# Patient Record
Sex: Female | Born: 1935 | Race: White | Hispanic: No | Smoking: Never smoker
Health system: Southern US, Community
[De-identification: ages and names within clinical notes are randomized; demographics above are authoritative.]

## PROBLEM LIST (undated history)

## (undated) DIAGNOSIS — E785 Hyperlipidemia, unspecified: Secondary | ICD-10-CM

## (undated) DIAGNOSIS — I10 Essential (primary) hypertension: Secondary | ICD-10-CM

## (undated) DIAGNOSIS — E079 Disorder of thyroid, unspecified: Secondary | ICD-10-CM

## (undated) HISTORY — DX: Essential (primary) hypertension: I10

## (undated) HISTORY — PX: DILATION AND CURETTAGE OF UTERUS: SHX78

## (undated) HISTORY — PX: CHOLECYSTECTOMY: SHX55

## (undated) HISTORY — DX: Hyperlipidemia, unspecified: E78.5

## (undated) HISTORY — PX: BREAST SURGERY: SHX581

---

## 2001-04-03 ENCOUNTER — Encounter: Admission: RE | Admit: 2001-04-03 | Discharge: 2001-04-03 | Payer: Self-pay | Admitting: Orthopedic Surgery

## 2001-04-03 ENCOUNTER — Ambulatory Visit (HOSPITAL_BASED_OUTPATIENT_CLINIC_OR_DEPARTMENT_OTHER): Admission: RE | Admit: 2001-04-03 | Discharge: 2001-04-04 | Payer: Self-pay | Admitting: Orthopedic Surgery

## 2001-04-03 ENCOUNTER — Encounter: Payer: Self-pay | Admitting: Orthopedic Surgery

## 2004-05-08 ENCOUNTER — Inpatient Hospital Stay (HOSPITAL_COMMUNITY): Admission: RE | Admit: 2004-05-08 | Discharge: 2004-05-11 | Payer: Self-pay | Admitting: Orthopedic Surgery

## 2004-05-08 ENCOUNTER — Ambulatory Visit: Payer: Self-pay | Admitting: Physical Medicine & Rehabilitation

## 2004-05-11 ENCOUNTER — Inpatient Hospital Stay
Admission: RE | Admit: 2004-05-11 | Discharge: 2004-05-16 | Payer: Self-pay | Admitting: Physical Medicine & Rehabilitation

## 2005-11-07 ENCOUNTER — Encounter: Admission: RE | Admit: 2005-11-07 | Discharge: 2005-11-07 | Payer: Self-pay | Admitting: Orthopaedic Surgery

## 2007-02-12 ENCOUNTER — Encounter: Admission: RE | Admit: 2007-02-12 | Discharge: 2007-02-12 | Payer: Self-pay | Admitting: Orthopedic Surgery

## 2007-02-13 ENCOUNTER — Ambulatory Visit (HOSPITAL_BASED_OUTPATIENT_CLINIC_OR_DEPARTMENT_OTHER): Admission: RE | Admit: 2007-02-13 | Discharge: 2007-02-13 | Payer: Self-pay | Admitting: Orthopedic Surgery

## 2009-07-14 ENCOUNTER — Inpatient Hospital Stay: Payer: Self-pay | Admitting: Internal Medicine

## 2010-06-25 ENCOUNTER — Encounter: Payer: Self-pay | Admitting: Orthopaedic Surgery

## 2010-10-17 NOTE — Op Note (Signed)
[name], [name]                  ACCOUNT NO.:  [id number]   MEDICAL RECORD NO.:  [id number]          PATIENT TYPE:  AMB   LOCATION:  DSC                          FACILITY:  [facility]   PHYSICIAN:  Loreta Ave, M.D. DATE OF BIRTH:  [birth year]   DATE OF PROCEDURE:  02/13/2007  DATE OF DISCHARGE:                               OPERATIVE REPORT   PREOPERATIVE DIAGNOSIS:  Right shoulder recurrent impingement syndrome  with question recurrent rotator cuff tear.  Rupture long head biceps  tendon.   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  Complete tear long head biceps tendon with a large  retained segment of this intra-articular.  No evidence of recurrent cuff  tear or significant impingement.   PROCEDURE:  Right shoulder exam under anesthesia, arthroscopy with  debridement of remaining stump of biceps tendon.  Glenohumeral  chondroplasty with removal of synovitis.  Assessment of subacromial  space.   SURGEON:  Loreta Ave, M.D.   ASSISTANT:  Genene Churn. Barry Dienes, P.A.-C.   ANESTHESIA:  General   BLOOD LOSS:  Minimal.   SPECIMENS:  None.   COMPLICATIONS:  None.   DRESSING:  Soft compressive with a sling.   DESCRIPTION OF PROCEDURE:  The patient was brought to the operating room  and placed on the operating table in a supine position.  After adequate  anesthesia had been obtained, the right shoulder was examined.  Full  motion stable shoulder.  Obvious rupture of long head of biceps tendon.  Placed in the beach chair position on a shoulder positioner, prepped and  draped in the usual sterile fashion.  Three portals, anterior,  posterior, and lateral.  Shoulder entered with a blunt obturator and  distended.  Arthroscope was introduced, inspected.  Obvious complete  tear long head biceps tendon.  A relatively long stump still mobile  within the shoulder.  That was complete debrided.  Some grade 2 and 3  changes throughout the  glenohumeral joint with chondral loose bodies,  all debrided and chondroplasty to a stable surface.  No areas of grade  4.  The under surface of the cuff was intact.  Cannula redirected  subacromially.  No evidence of recurrent bony impingement, either from  the acromion or the [facility] interval.  I thoroughly inspected the top of the  cuff from all angles.  There were no recurrent tears.  Some bursitis and  adhesions  debrided.  After confirmation of no recurrent tear, the instruments and  fluid were removed.  The portals were all closed with nylon.  A sterile  compressive dressing applied.  Sling applied.  Anesthesia reversed.  Brought to the recovery room. Tolerated surgery well.  No complications.      Loreta Ave, M.D.  Electronically Signed     DFM/MEDQ  D:  02/13/2007  T:  02/13/2007  Job:  [id number]

## 2010-10-20 NOTE — Discharge Summary (Signed)
[name], [name]                  ACCOUNT NO.:  [id number]   MEDICAL RECORD NO.:  [id number]          PATIENT TYPE:  INP   LOCATION:  5018                         FACILITY:  [facility]   PHYSICIAN:  Loreta Ave, M.D. DATE OF BIRTH:  10-25-1927   DATE OF ADMISSION:  05/08/2004  DATE OF DISCHARGE:  05/11/2004                           DISCHARGE SUMMARY - REFERRING   DISCHARGE DIAGNOSES:  1.  Status post right total hip replacement for end-stage degenerative joint      disease.  2.  Hypothyroidism, not otherwise specified.  3.  Long-term use of anticoagulants.   HISTORY OF PRESENT ILLNESS:  This 77 year old white female with end-stage  right hip degenerative joint disease presented to our office with a history  of chronic pain.  She had progressive worsening of symptoms with a failed  response to conservative treatment, a significant decrease in her daily  activities due to the ongoing complaints.   PRE-ADMISSION LABORATORY DATA:  WBC 8.9, hemoglobin 14.3, hematocrit 41.0,  platelets 292.  PT 12.7, INR 0.9, PTT 32.  Sodium 139, potassium 3.8,  chloride 105, CO2 of 29, glucose 81, BUN 14, creatinine 1.1, calcium 9.2.  TB 6.0, albumin 3.7.  AST 26, ALT 17, alkaline phosphatase 60, total  bilirubin 0.8.  Urinalysis negative.   HOSPITAL COURSE:  On May 08, 2004, the patient was taken to the [facility] Operating Room, and a right total hip replacement  procedure was performed.  The surgeon was Dr. Loreta Ave, assisted by  Genene Churn. Barry Dienes, P.A.-C.  Anesthesia general.  EBL 500 mL.  There were no  surgical or anesthesia complications.  The patient was transferred to the  recovery room in stable condition.   On May 09, 2004, the patient was doing well.  No specific complaints.  Pharmacy protocol Coumadin started.  Sodium was 125, potassium 3.7, chloride  96, CO2 of 25, BUN 8, creatinine 0.8, glucose 120.  Hemoglobin 11.8.  INR  was 1.0.  His temperature  was 99.4 degrees.  The wound looked good.  The  staples were intact.  No signs of infection.  She was neurovascularly  intact.  The calf was nontender.  Change IV to normal saline plus 20 of KCl.  Dressing changes.  Bed to chair b.i.d.   On May 10, 2004, the patient had good pain control.  Was somewhat  lethargic earlier, but this has resolved.  Temperature was 97.8 degrees,  pulse 82, respirations 24, blood pressure 119/54.  Sodium 137, potassium  4.0.  INR 1.1.  Hemoglobin 12.0.  The wound looked good.  No signs of  infection.  Mild right calf tenderness.  No swelling.  Neurovascularly  intact.  Negative Homans.  Discontinued Foley.  IV heparin locked.  There is  a question of some rash and nausea with Percocet, and the pain medication  was changed to [employer] one tab p.o. q.4-6h. p.r.n.   On May 11, 2004, the patient had good pain control.  Some increased  sedation with Mepergan Forte.  Temperature 99.8 degrees, pulse 79,  respirations 24,  blood pressure 108/78.  Electrolytes stable.  INR 1.3.  Hemoglobin 11.2.  The wound looked good with no signs of infection.  Neurovascularly intact.  Discontinue the Mepergan Forte and start Demerol 50  mg, one tab p.o. q.4-6h. p.r.n. pain.  Discontinued IV.   DISPOSITION:  The patient was ready for a transfer to [facility].   MEDICATIONS:  1.  Pharmacy protocol Coumadin.  2.  Demerol 50 mg, one tab p.o. q.4-6h. p.r.n. pain.  3.  Resume all previously-ordered medications.   CONDITION ON DISCHARGE:  Good and stable.   DISCHARGE INSTRUCTIONS:  The patient will work with physical therapy to  improve ambulation and strengthening.  Hip staples to be removed two weeks  postoperatively.  Will remain on Coumadin for deep venous thrombosis  prophylaxis x3-4 weeks postoperatively.   FOLLOWUP:  She will follow up in our office in two weeks for a recheck.  To  return sooner p.r.n.      JMO/MEDQ  D:  07/10/2004  T:  07/10/2004  Job:  [id number]

## 2010-10-20 NOTE — Op Note (Signed)
[place]  Patient:    [name], [name] [facility] Visit Number: [id number] MRN: [id number]          Service Type: DSU Location: [facility] Attending Physician:  Colbert Ewing Dictated by:   Loreta Ave, M.D. Proc. Date: 04/03/01 Admit Date:  04/03/2001                             Operative Report  PREOPERATIVE DIAGNOSES: 1. Rotator cuff tear, right shoulder, impingement. 2. Post-traumatic degenerative joint disease, acromioclavicular joint.  POSTOPERATIVE DIAGNOSES: 1. Rotator cuff tear, right shoulder, impingement. 2. Post-traumatic degenerative joint disease, acromioclavicular joint.  PROCEDURES: 1. Right shoulder examination under anesthesia, arthroscopy, assessment of    rotator cuff. 2. Arthroscopic acromioplasty with coracoacromial ligament release. 3. Excision distal clavicle. 4. Mini-open repair rotator cuff tear with Concept repair system, utilizing    #2 Fibrewire.  SURGEON:  Loreta Ave, M.D.  ASSISTANT:  Arlys John D. Petrarca, P.A.-C.  ANESTHESIA:  General.  ESTIMATED BLOOD LOSS:  Minimal.  SPECIMENS:  None.  CULTURES:  None.  DRESSING:  Soft compressive with shoulder immobilizer.  DESCRIPTION OF PROCEDURE:  Patient brought to the operating room and placed on the operating table in supine position.   After adequate anesthesia had been obtained, she was placed in a beach chair position.  With this she had an episode of transient hypotension that improved immediately as soon as she was brought back down flat.  Once there was adequate stabilization with fluid replacement, we were able to sit her up comfortably without drop in blood pressure.  Shoulder examined.  Full motion, stable shoulder.  Prepped and draped in the usual sterile fashion.  Three standard arthroscopic portals, anterior, posterior, lateral.  Shoulder entered with a blunt obturator and distended and inspected.  Glenohumeral joint had minimal degenerative  changes. Labrum intact.  Biceps tendon and biceps anchor intact.  Capsular and ligamentous structures intact.  Avulsion of the entire supraspinatus tendon from the biceps tendon all the way back to the infraspinatus.  A thin slip of capsule partially below but essentially a functional complete tear with about a centimeter of retraction.  Reasonable tissue quality and mobile.  The cannula was redirected subacromially.  Cuff tear assessed, and it was found to be very repairable.  Type 2 acromion with evidence of impingement and abrasive tearing on the remaining cuff.  Bursa resected, cuff debrided ,and acromioplasty to a type 1 acromion utilizing all three portals and the shaver and high-speed bur.  CA ligament incised, hemostasis with cautery.  Distal clavicle with grade 4 changes with periarticular spurs.  Lateral centimeter resected and periarticular spurs removed.  Adequacy of decompression and acromioplasty, distal clavicle excision confirmed viewing from all portals. Instruments and fluid removed.  Lateral portal opened into a deltoid-splitting incision exposing the subacromial space.  The cuff was mobilized and well-captured with two #2 Fibrewire sutures that were woven in a [place] fashion well into the cuff to well capture the cuff.  Bony trough created in the humerus at the original attachment site.  The sutures were woven through bony tunnels utilizing Concept repair system.  Once complete, these were pulled out firmly and tied over a bony bridge, yielding a nice, firm, watertight closure of the cuff without undue tension even through full passive motion.  Adequacy of decompression confirmed digitally at the time of cuff repair.  Wound copiously irrigated.  Deltoid closed with Vicryl.  Skin and  subcutaneous tissue with Vicryl.  Portals closed with nylon.  Margins of the wound injected with Marcaine, as was the subacromial space.  Sterile compressive dressing applied.  Shoulder  immobilizer applied.  Anesthesia reversed.  Brought to the recovery room.  She tolerated the surgery well with no complications. Dictated by:   Loreta Ave, M.D. Attending Physician:  Colbert Ewing DD:  04/04/01 TD:  04/05/01 Job: [id number]

## 2010-10-20 NOTE — Op Note (Signed)
[name], [name]                  ACCOUNT NO.:  [id number]   MEDICAL RECORD NO.:  [id number]          PATIENT TYPE:  INP   LOCATION:  2856                         FACILITY:  [facility]   PHYSICIAN:  Loreta Ave, M.D. DATE OF BIRTH:  09/19/1927   DATE OF PROCEDURE:  05/08/2004  DATE OF DISCHARGE:                                 OPERATIVE REPORT   PREOPERATIVE DIAGNOSES:  End stage degenerative arthritis, right hip.   POSTOPERATIVE DIAGNOSES:  End stage degenerative arthritis, right hip.   OPERATION PERFORMED:  Right total hip replacement.  Osteonics prosthesis.  Press-fit 52 mm Trident acetabular component, screw fixation times two.  10  degree polyethylene insert, size E cup 32 mm internal diameter.  Cemented  femoral component 36 Eon Plus stem.  A #2 distal cement restricter.  A 32 mm  metallic head, -5.   SURGEON:  Loreta Ave, M.D.   ASSISTANT:  Genene Churn. Denton Meek.   ANESTHESIA:  General.   ESTIMATED BLOOD LOSS:  500 mL.   BLOOD REPLACED:  None.   SPECIMENS:  Excised bone and soft tissue.   CULTURES:  None.   COMPLICATIONS:  None.   DRESSING:  Soft compressive with abduction pillow.   DESCRIPTION OF PROCEDURE:  The patient was brought to the operating room and  after adequate anesthesia had been obtained, turned to a lateral position  with appropriate padding and support.  Prepped and draped in the usual  sterile fashion.  Incision along the femur extending posterior superior.  Skin and subcutaneous tissue were divided.  Iliotibial band incised and  Charnley retractor put in place.  Neurovascular structures identified and  protected.  External rotator and cuff was taken down off the back of the  intertrochanteric groove on the femur and tagged with FiberWire.  Hip  dislocated posteriorly.  Grade 4 changes.  Femoral head removed one  fingerbreadth above the lesser trochanter in line with the definitive  prosthesis.  Acetabulum exposed.  Grade 4 changes.  Soft  tissue removed.  Sequential reaming up to nice spherical bleeding bone appropriate medial and  inferior placement.  Sized for a 52 mm cuff which was hammered in place with  45 degrees of abduction and 20 degrees of anteversion.  Excellent capturing  and fixation.  Augmented with two screws through the cuff, a 60 mm and a 20  mm.  10 degree polyethylene insert with the overhang placed superior was  then hammered in place in the metallic cup.  32 mm internal diameter.  Attention was turned to the femur.  Reaming with a power and hand held  reamers up to good sizing proximal and distal for a #6 cemented component.  Appropriate trialing and choosing a #6 component with a -5 mm 32 mm head.  With that head and restoring normal anteversion of the femur with a  component, I had excellent leg length restoration and good stability in  flexion and extension.  All trials were removed.  Distal cement restricter  placed 2 cm distal to the femoral component after appropriate sizing.  Copious irrigation of  the femur with the pulse irrigating device.  Cement  prepared, placed down the canal and the femoral component firmly cemented  into place restoring normal anteversion.  Once the cement had hardened, the  head was attached and the hip reduced.  Good restoration of leg lengths,  excellent stability of flexion and extension.  Wound irrigated.  External  rotator and capsule repaired to the back of the intertrochanteric groove on  the femur with the FiberWire and tied over a bony bridge.  Charnley  retractor removed.  Iliotibial band closed with #1 Vicryl.  Skin and  subcutaneous tissue with Vicryl and then staples.  Margins of the wound  injected with Marcaine.  Sterile compressive dressing applied.  Returned to  supine position.  Abduction pillow placed.  Anesthesia reversed.  Brought to  recovery room.  Tolerated surgery well without complication.      Valentino Saxon   DFM/MEDQ  D:  05/08/2004  T:  05/08/2004   Job:  [id number]
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
## 2011-03-16 LAB — POCT HEMOGLOBIN-HEMACUE
Hemoglobin: 17.5 — ABNORMAL HIGH
Operator id: 208731

## 2011-04-30 ENCOUNTER — Other Ambulatory Visit: Payer: Self-pay | Admitting: Orthopedic Surgery

## 2011-04-30 DIAGNOSIS — M545 Low back pain, unspecified: Secondary | ICD-10-CM

## 2011-05-08 ENCOUNTER — Ambulatory Visit
Admission: RE | Admit: 2011-05-08 | Discharge: 2011-05-08 | Disposition: A | Discharge status: Home / Self Care | Payer: Medicare Other | Source: Ambulatory Visit | Attending: Orthopedic Surgery | Admitting: Orthopedic Surgery

## 2011-05-08 DIAGNOSIS — M545 Low back pain, unspecified: Secondary | ICD-10-CM

## 2011-05-08 MED ORDER — METHYLPREDNISOLONE ACETATE 40 MG/ML INJ SUSP (RADIOLOG
120.0000 mg | Freq: Once | INTRAMUSCULAR | Status: AC
  Administered 2011-05-08: 120 mg via EPIDURAL

## 2011-05-08 MED ORDER — IOHEXOL 180 MG/ML  SOLN
1.0000 mL | Freq: Once | INTRAMUSCULAR | Status: AC | PRN
  Administered 2011-05-08: 1 mL via EPIDURAL

## 2011-05-08 NOTE — Patient Instructions (Signed)

## 2012-04-18 DIAGNOSIS — I10 Essential (primary) hypertension: Secondary | ICD-10-CM | POA: Insufficient documentation

## 2012-10-07 ENCOUNTER — Other Ambulatory Visit: Payer: Self-pay | Admitting: Orthopedic Surgery

## 2012-10-07 DIAGNOSIS — M4800 Spinal stenosis, site unspecified: Secondary | ICD-10-CM

## 2012-10-09 ENCOUNTER — Ambulatory Visit
Admission: RE | Admit: 2012-10-09 | Discharge: 2012-10-09 | Disposition: A | Discharge status: Home / Self Care | Payer: Medicare Other | Source: Ambulatory Visit | Attending: Orthopedic Surgery | Admitting: Orthopedic Surgery

## 2012-10-09 VITALS — BP 173/88 | HR 54

## 2012-10-09 DIAGNOSIS — M5126 Other intervertebral disc displacement, lumbar region: Secondary | ICD-10-CM

## 2012-10-09 DIAGNOSIS — M4800 Spinal stenosis, site unspecified: Secondary | ICD-10-CM

## 2012-10-09 MED ORDER — METHYLPREDNISOLONE ACETATE 40 MG/ML INJ SUSP (RADIOLOG
120.0000 mg | Freq: Once | INTRAMUSCULAR | Status: AC
  Administered 2012-10-09: 120 mg via EPIDURAL

## 2012-10-09 MED ORDER — IOHEXOL 180 MG/ML  SOLN
1.0000 mL | Freq: Once | INTRAMUSCULAR | Status: AC | PRN
  Administered 2012-10-09: 1 mL via EPIDURAL

## 2012-10-09 NOTE — Discharge Instructions (Signed)
 Post Procedure Spinal Discharge Instruction Sheet  1. You may resume a regular diet and any medications that you routinely take (including pain medications).  2. No driving day of procedure.  3. Light activity throughout the rest of the day.  Do not do any strenuous work, exercise, bending or lifting.  The day following the procedure, you can resume normal physical activity but you should refrain from exercising or physical therapy for at least three days thereafter.   Common Side Effects:   Headaches- take your usual medications as directed by your physician.  Increase your fluid intake.  Caffeinated beverages may be helpful.  Lie flat in bed until your headache resolves.   Restlessness or inability to sleep- you may have trouble sleeping for the next few days.  Ask your referring physician if you need any medication for sleep.   Facial flushing or redness- should subside within a few days.   Increased pain- a temporary increase in pain a day or two following your procedure is not unusual.  Take your pain medication as prescribed by your referring physician.   Leg cramps  Please contact our office at [phone] for the following symptoms:  Fever greater than 100 degrees.  Headaches unresolved with medication after 2-3 days.  Increased swelling, pain, or redness at injection site.  Thank you for visiting our office.

## 2012-10-13 ENCOUNTER — Telehealth: Payer: Self-pay | Admitting: Radiology

## 2012-10-13 NOTE — Telephone Encounter (Signed)
Pt having some numbness and tingling and is not feeling better yet from epidural injections. Explained that it may take a full 7 days for her to feel better and if she is not better to f/u with Dr. Eulah Pont.

## 2012-10-20 ENCOUNTER — Emergency Department: Payer: Self-pay | Admitting: Unknown Physician Specialty

## 2012-10-20 LAB — COMPREHENSIVE METABOLIC PANEL
Alkaline Phosphatase: 52 U/L (ref 50–136)
Co2: 29 mmol/L (ref 21–32)
Creatinine: 0.73 mg/dL (ref 0.60–1.30)
EGFR (Non-African Amer.): >60
Osmolality: 262 (ref 275–301)
Potassium: 4.1 mmol/L (ref 3.5–5.1)
SGOT(AST): 65 U/L — ABNORMAL HIGH (ref 15–37)
Sodium: 130 mmol/L — ABNORMAL LOW (ref 136–145)
Total Protein: 7.5 g/dL (ref 6.4–8.2)

## 2012-10-20 LAB — CBC
HCT: 43.9 % (ref 35.0–47.0)
HGB: 14.9 g/dL (ref 12.0–16.0)
MCH: 33.8 pg (ref 26.0–34.0)
MCHC: 34 g/dL (ref 32.0–36.0)
MCV: 100 fL (ref 80–100)
Platelet: 267 x10 3/mm 3 (ref 150–440)
RBC: 4.4 X10 6/mm 3 (ref 3.80–5.20)
RDW: 13.1 % (ref 11.5–14.5)
WBC: 12 10*3/uL — ABNORMAL HIGH (ref 3.6–11.0)

## 2012-10-20 LAB — COMPREHENSIVE METABOLIC PANEL WITH GFR
Albumin: 4.4 g/dL (ref 3.4–5.0)
Anion Gap: 7 (ref 7–16)
BUN: 17 mg/dL (ref 7–18)
Bilirubin,Total: 0.9 mg/dL (ref 0.2–1.0)
Calcium, Total: 9.5 mg/dL (ref 8.5–10.1)
Chloride: 94 mmol/L — ABNORMAL LOW (ref 98–107)
EGFR (African American): >60
Glucose: 94 mg/dL (ref 65–99)
SGPT (ALT): 55 U/L (ref 12–78)

## 2012-10-20 LAB — TROPONIN I: Troponin-I: <0.02 ng/mL

## 2012-11-21 DIAGNOSIS — M159 Polyosteoarthritis, unspecified: Secondary | ICD-10-CM | POA: Insufficient documentation

## 2012-11-21 DIAGNOSIS — H6120 Impacted cerumen, unspecified ear: Secondary | ICD-10-CM | POA: Insufficient documentation

## 2012-11-21 DIAGNOSIS — K5790 Diverticulosis of intestine, part unspecified, without perforation or abscess without bleeding: Secondary | ICD-10-CM | POA: Insufficient documentation

## 2012-11-21 DIAGNOSIS — N959 Unspecified menopausal and perimenopausal disorder: Secondary | ICD-10-CM | POA: Insufficient documentation

## 2012-11-21 DIAGNOSIS — E039 Hypothyroidism, unspecified: Secondary | ICD-10-CM | POA: Insufficient documentation

## 2013-04-01 DIAGNOSIS — J302 Other seasonal allergic rhinitis: Secondary | ICD-10-CM | POA: Insufficient documentation

## 2013-04-01 DIAGNOSIS — H9190 Unspecified hearing loss, unspecified ear: Secondary | ICD-10-CM | POA: Insufficient documentation

## 2013-09-20 ENCOUNTER — Observation Stay: Payer: Self-pay | Admitting: Internal Medicine

## 2013-09-20 LAB — URINALYSIS, COMPLETE
Bacteria: NONE SEEN
Bilirubin,UR: NEGATIVE
Blood: NEGATIVE
Glucose,UR: NEGATIVE mg/dL (ref 0–75)
Ketone: NEGATIVE
Nitrite: NEGATIVE
Ph: 7 (ref 4.5–8.0)
Protein: NEGATIVE
RBC,UR: 1 /HPF (ref 0–5)
Specific Gravity: 1.005 (ref 1.003–1.030)
Squamous Epithelial: 1
WBC UR: 1 /HPF (ref 0–5)

## 2013-09-20 LAB — COMPREHENSIVE METABOLIC PANEL
Albumin: 3.5 g/dL (ref 3.4–5.0)
Alkaline Phosphatase: 49 U/L
Anion Gap: 6 — ABNORMAL LOW (ref 7–16)
Bilirubin,Total: 0.4 mg/dL (ref 0.2–1.0)
Co2: 31 mmol/L (ref 21–32)
Creatinine: 1.03 mg/dL (ref 0.60–1.30)
EGFR (African American): 58 — ABNORMAL LOW
EGFR (Non-African Amer.): 50 — ABNORMAL LOW
Glucose: 94 mg/dL (ref 65–99)
Potassium: 4 mmol/L (ref 3.5–5.1)
SGOT(AST): 49 U/L — ABNORMAL HIGH (ref 15–37)
SGPT (ALT): 34 U/L (ref 12–78)
Sodium: 139 mmol/L (ref 136–145)
Total Protein: 6.8 g/dL (ref 6.4–8.2)

## 2013-09-20 LAB — CBC
HCT: 45.6 % (ref 35.0–47.0)
HGB: 14.9 g/dL (ref 12.0–16.0)
MCH: 32.3 pg (ref 26.0–34.0)
MCHC: 32.6 g/dL (ref 32.0–36.0)
MCV: 99 fL (ref 80–100)
Platelet: 200 10*3/uL (ref 150–440)
RBC: 4.6 X10 6/mm 3 (ref 3.80–5.20)
RDW: 12.8 % (ref 11.5–14.5)
WBC: 8.3 10*3/uL (ref 3.6–11.0)

## 2013-09-20 LAB — COMPREHENSIVE METABOLIC PANEL WITH GFR
BUN: 14 mg/dL (ref 7–18)
Calcium, Total: 9.2 mg/dL (ref 8.5–10.1)
Chloride: 102 mmol/L (ref 98–107)
Osmolality: 278 (ref 275–301)

## 2013-09-20 LAB — CK TOTAL AND CKMB (NOT AT ARMC)
CK, Total: 153 U/L
CK, Total: 228 U/L — ABNORMAL HIGH
CK-MB: 3.6 ng/mL (ref 0.5–3.6)
CK-MB: 4.7 ng/mL — ABNORMAL HIGH (ref 0.5–3.6)

## 2013-09-20 LAB — TROPONIN I
Troponin-I: <0.02 ng/mL
Troponin-I: <0.02 ng/mL
Troponin-I: <0.02 ng/mL

## 2013-09-20 LAB — D-DIMER(ARMC): D-Dimer: 1181 ng/ml

## 2013-09-21 LAB — CBC WITH DIFFERENTIAL/PLATELET
Eosinophil: 3 %
HCT: 41.5 % (ref 35.0–47.0)
HGB: 13.7 g/dL (ref 12.0–16.0)
Lymphocytes: 67 %
MCH: 32.7 pg (ref 26.0–34.0)
MCHC: 33 g/dL (ref 32.0–36.0)
MCV: 99 fL (ref 80–100)
Monocytes: 12 %
Platelet: 194 x10 3/mm 3 (ref 150–440)
RBC: 4.19 10*6/uL (ref 3.80–5.20)
RDW: 12.7 % (ref 11.5–14.5)
Segmented Neutrophils: 11 %
Variant Lymphocyte - H1-Rlymph: 7 %
WBC: 5.9 x10 3/mm 3 (ref 3.6–11.0)

## 2013-09-21 LAB — BASIC METABOLIC PANEL WITH GFR
Anion Gap: 5 — ABNORMAL LOW (ref 7–16)
BUN: 13 mg/dL (ref 7–18)
Chloride: 109 mmol/L — ABNORMAL HIGH (ref 98–107)
Co2: 28 mmol/L (ref 21–32)
Creatinine: 0.82 mg/dL (ref 0.60–1.30)
Glucose: 92 mg/dL (ref 65–99)
Osmolality: 283 (ref 275–301)

## 2013-09-21 LAB — BASIC METABOLIC PANEL
Calcium, Total: 8.3 mg/dL — ABNORMAL LOW (ref 8.5–10.1)
EGFR (African American): >60
EGFR (Non-African Amer.): >60
Potassium: 4 mmol/L (ref 3.5–5.1)
Sodium: 142 mmol/L (ref 136–145)

## 2013-09-21 LAB — TSH: Thyroid Stimulating Horm: 4.19 u[IU]/mL

## 2014-09-25 NOTE — H&P (Signed)
PATIENT NAME:  [name], [name] MR#:  [id number] DATE OF BIRTH:  04-19-1928  DATE OF ADMISSION:  09/20/2013  REFERRING PHYSICIAN: Sheryl L. Mindi Junker, MD  FAMILY PHYSICIAN: Dr. Carleene Cooper in [place].   REASON FOR ADMISSION: Syncope.   HISTORY OF PRESENT ILLNESS: The patient is an 81 year old female with a history of benign hypertension, hypothyroidism, osteoarthritis, chronic back pain. Presents to the Emergency Room after passing out in church. Felt lightheaded prior to her syncopal episode. Denies any previous history of same. Denies any cardiac history or history of stroke. In the Emergency Room, the patient was noted to have an elevated d-dimer. She was also mildly bradycardic. She is now admitted for further evaluation.   PAST MEDICAL HISTORY: 1.  Benign hypertension.  2.  Hypothyroidism.  3.  Osteoarthritis.  4.  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  5.  Previous splenectomy.  6.  Status post rotator cuff surgery.  7.  Status post right hip surgery.  8.  Environmental allergies.   MEDICATIONS:  1.  B complex 1 p.o. daily.  2.  Tramadol 50 mg p.o. b.i.d.  3.  Synthroid 50 mcg p.o. daily.  4.  Provera 2.5 mg p.o. 2 times per week.  5.  Estrace 0.5 mg p.o. 2 times per week.  6.  Claritin 10 mg p.o. daily.  7.  Fosamax 70 mg p.o. q. week.  8.  Clonidine 0.1 mg p.o. daily.  9.  Aspirin 81 mg p.o. daily.   ALLERGIES: No known drug allergies.   SOCIAL HISTORY: The patient is widowed. No history of alcohol or tobacco abuse.   FAMILY HISTORY: Positive for stroke and hypertension but otherwise unremarkable.   REVIEW OF SYSTEMS:    CONSTITUTIONAL: No fever or change in weight.  EYES: No blurred or double vision. No glaucoma.  ENT: No tinnitus or hearing loss. No nasal discharge or bleeding. No difficulty swallowing.  RESPIRATORY: No cough or wheezing. Denies hemoptysis.  CARDIOVASCULAR: No chest pain or orthopnea. No palpitations.  GASTROINTESTINAL: No nausea, vomiting or  diarrhea. No abdominal pain. No change in bowel habits.  GENITOURINARY: No dysuria or hematuria. No incontinence.  ENDOCRINE: No polyuria or polydipsia. No heat or cold intolerance.  HEMATOLOGIC: The patient denies anemia, easy bruising or bleeding.  LYMPHATIC: No swollen glands.  MUSCULOSKELETAL: The patient denies pain in her neck, back, shoulders, knees or hips. No gout.  NEUROLOGIC: No numbness or migraines. Denies stroke or seizures.  PSYCHOLOGICAL: The patient denies anxiety, insomnia or depression.   PHYSICAL EXAMINATION: GENERAL: The patient is in no acute distress.  VITAL SIGNS: Currently remarkable for a blood pressure of 168/88, heart rate 64, respiratory rate of 18, temperature of 98, sat 96% on room air.  HEENT: Normocephalic, atraumatic. Pupils are equal, round and reactive to light and accommodation. Extraocular movements are intact. Sclerae are anicteric. Conjunctivae are clear. Oropharynx clear. NECK: Supple without JVD. No adenopathy or thyromegaly is seen.  LUNGS: Clear to auscultation and percussion without wheezes, rales or rhonchi. No dullness. Respiratory effort is normal.  CARDIAC: Regular rate and rhythm. Normal S1, S2. No significant rubs, murmurs or gallops. PMI is nondisplaced. Chest wall is nontender.  ABDOMEN: Soft, nontender with normoactive bowel sounds. No organomegaly or masses were appreciated. No hernias or bruits were noted.  EXTREMITIES: Without clubbing, cyanosis or edema. Pulses were 2+ bilaterally.  SKIN: Warm and dry without rash or lesions.  NEUROLOGIC: Cranial nerves II through XII grossly intact. Deep tendon reflexes were symmetric. Motor and sensory  exams nonfocal.  PSYCHIATRIC: Revealed a patient who is alert and oriented to person, place and time. She was cooperative and used good judgment.    LABORATORY, DIAGNOSTIC AND RADIOLOGICAL DATA:  Glucose was 94 with a BUN of 14, creatinine 1.03 and a GFR of 50; sodium 139, with potassium 4.0. Troponin  was less than 0.02. White count 8.3 with a hemoglobin of 14.9. Urinalysis negative. D-dimer was elevated at 1181. CT of the head revealed no acute intracranial abnormalities. Chest x-ray revealed an elevated right hemidiaphragm but was otherwise unremarkable.   ASSESSMENT: 1.  Syncope.  2.  Benign hypertension.  3.  Elevated d-dimer.  4.  Bradycardia.  5.  Hypothyroidism.  6.  Osteoarthritis.   PLAN: The patient will be observed on telemetry. We will follow serial cardiac enzymes. Will follow her bradycardia as well. We will obtain carotid Dopplers and an echocardiogram because of her syncopal episode. We will obtain a V/Q scan to rule out PE given her syncope and elevated d-dimer. Will proceed with MRI of the brain without contrast to rule out stroke. We will check her TSH. Follow up routine labs in the morning. Further treatment and evaluation will depend upon the patient's progress.   TOTAL TIME SPENT ON THIS PATIENT: 50 minutes.    ____________________________ Duane LopeJeffrey D. Judithann SheenSparks, MD jds:cs D: 09/20/2013 14:35:27 ET T: 09/20/2013 15:43:45 ET JOB#: [id number]  cc: Duane LopeJeffrey D. Judithann SheenSparks, MD, <Dictator> Carleene CooperJonathan Klein Jayvyn Haselton D Hutch Rhett MD ELECTRONICALLY SIGNED 09/20/2013 17:13

## 2014-09-25 NOTE — Discharge Summary (Signed)
PATIENT NAME:  [name], [name] MR#:  [id number] DATE OF BIRTH:  [birth year]  DATE OF ADMISSION:  09/20/2013 DATE OF DISCHARGE:  09/21/2013  PRESENTING COMPLAINT: Syncopal episode.   DISCHARGE DIAGNOSES:  1.  Syncope suspected due to dehydration from diarrhea, resolved.  2.  Hypothyroidism.   CODE STATUS: Full code.   MEDICATIONS:  1.  Estradiol 0.5 mg p.o. b.i.d.  2.  Synthroid 50 mcg p.o. daily.  3.  Tramadol 50 mg b.i.d.  4.  Medroxyprogesterone 2.5 mg p.o. 2 times a week.  5.  Multivitamin p.o. daily.  6.  Loratadine 10 mg daily.  7.  MiraLax p.o. daily.  8.  Tylenol Extra Strength 2 tablets every 6 hours as needed.  9.  Fosamax plus D 1000 units once a month.  10. Calcium with vitamin D 1 p.o. once a month.  11. Vitamin B complex 100, 1 capsule once a month.  12. Aspirin 81 mg daily.  13. Tylenol arthritis cap 650 p.o. every 8 hours as needed. 14. Clonidine 0.1 mg 1 tablet once a day as needed. .   FOLLOWUP: [place] Dr. [place]ene CooperJonathan [place] 1 [place] 2 weeks.   MRI of the brain no acute intracranial abnormalities. Echo Doppler showed EF of 50% to 55%, normal global left ventricular systolic function, mild mitral valve regurgitation, mildly increased left ventricular posterior wall thickness, mild tricuspid regurgitation. CT angiography the chest was essentially negative for PE. Ultrasound carotid Doppler, no significant atherosclerotic or vascular disease. MRI of the brain, normal-appearing MRI for age. TSH is 4.19. CBC: Basic metabolic panel within normal limits. Cardiac enzymes x 3 negative. UA negative for UTI.   BRIEF SUMMARY OF HOSPITAL COURSE: Adrienne MochaMary Mcknight is an 81 year old Caucasian female with past medical history of hypothyroidism, who comes in with syncopal episode at church. She was admitted with:  1.  Syncope, which is suspected due to dehydration from diarrhea, which is ongoing for the last couple of days at home. Her diarrhea resolved. She received IV fluids. Blood  pressure remained stable. Her workup for syncope with the radiologist that was testable was negative. She remained in sinus rhythm.  2.  History of hypertension. The patient takes clonidine on a p.r.n. basis.  3.  Hypothyroidism. Synthroid was continued.  4.  Hospital stay otherwise remained stable.   CODE STATUS: The patient remained a full code.   FOLLOWUP: She will follow up with her primary care physician.   TIME SPENT: 40 minutes.  ____________________________ Wylie HailSona A. Allena KatzPatel, MD sap:aw D: 09/22/2013 07:02:12 ET T: 09/22/2013 07:21:06 ET JOB#: [id number]  cc: Gracynn Rajewski A. Allena KatzPatel, MD, <Dictator> Elio ForgetJOHNATHAN KLEIN, [place] OraSONA A Rylon Poitra MD ELECTRONICALLY SIGNED 09/28/2013 9:10

## 2015-01-04 ENCOUNTER — Other Ambulatory Visit: Payer: Self-pay | Admitting: Physical Medicine and Rehabilitation

## 2015-01-04 DIAGNOSIS — M47812 Spondylosis without myelopathy or radiculopathy, cervical region: Secondary | ICD-10-CM

## 2015-01-06 ENCOUNTER — Ambulatory Visit
Admission: RE | Admit: 2015-01-06 | Discharge: 2015-01-06 | Disposition: A | Discharge status: Home / Self Care | Payer: Medicare Other | Source: Ambulatory Visit | Attending: Physical Medicine and Rehabilitation | Admitting: Physical Medicine and Rehabilitation

## 2015-01-06 VITALS — BP 193/81 | HR 52

## 2015-01-06 DIAGNOSIS — M5010 Cervical disc disorder with radiculopathy, unspecified cervical region: Secondary | ICD-10-CM

## 2015-01-06 DIAGNOSIS — M47812 Spondylosis without myelopathy or radiculopathy, cervical region: Secondary | ICD-10-CM

## 2015-01-06 MED ORDER — IOHEXOL 300 MG/ML  SOLN
1.0000 mL | Freq: Once | INTRAMUSCULAR | Status: AC | PRN
  Administered 2015-01-06: 1 mL via EPIDURAL

## 2015-01-06 MED ORDER — TRIAMCINOLONE ACETONIDE 40 MG/ML IJ SUSP (RADIOLOGY)
60.0000 mg | Freq: Once | INTRAMUSCULAR | Status: AC
  Administered 2015-01-06: 60 mg via EPIDURAL

## 2015-01-06 NOTE — Discharge Instructions (Signed)

## 2015-01-20 ENCOUNTER — Other Ambulatory Visit: Payer: Self-pay | Admitting: Physical Medicine and Rehabilitation

## 2015-01-20 DIAGNOSIS — M47812 Spondylosis without myelopathy or radiculopathy, cervical region: Secondary | ICD-10-CM

## 2015-01-27 ENCOUNTER — Other Ambulatory Visit: Payer: Medicare Other

## 2015-02-04 ENCOUNTER — Ambulatory Visit
Admission: RE | Admit: 2015-02-04 | Discharge: 2015-02-04 | Disposition: A | Discharge status: Home / Self Care | Payer: Medicare Other | Source: Ambulatory Visit | Attending: Physical Medicine and Rehabilitation | Admitting: Physical Medicine and Rehabilitation

## 2015-02-04 DIAGNOSIS — Z9181 History of falling: Secondary | ICD-10-CM | POA: Insufficient documentation

## 2015-02-04 DIAGNOSIS — M47812 Spondylosis without myelopathy or radiculopathy, cervical region: Secondary | ICD-10-CM

## 2015-02-04 DIAGNOSIS — E079 Disorder of thyroid, unspecified: Secondary | ICD-10-CM | POA: Insufficient documentation

## 2015-02-04 DIAGNOSIS — F32A Depression, unspecified: Secondary | ICD-10-CM | POA: Insufficient documentation

## 2015-02-04 DIAGNOSIS — F329 Major depressive disorder, single episode, unspecified: Secondary | ICD-10-CM | POA: Insufficient documentation

## 2015-02-04 DIAGNOSIS — M51379 Other intervertebral disc degeneration, lumbosacral region without mention of lumbar back pain or lower extremity pain: Secondary | ICD-10-CM | POA: Insufficient documentation

## 2015-02-04 DIAGNOSIS — M5137 Other intervertebral disc degeneration, lumbosacral region: Secondary | ICD-10-CM | POA: Insufficient documentation

## 2015-02-04 MED ORDER — IOHEXOL 300 MG/ML  SOLN
1.0000 mL | Freq: Once | INTRAMUSCULAR | Status: DC | PRN
  Administered 2015-02-04: 1 mL via EPIDURAL

## 2015-02-04 MED ORDER — TRIAMCINOLONE ACETONIDE 40 MG/ML IJ SUSP (RADIOLOGY)
60.0000 mg | Freq: Once | INTRAMUSCULAR | Status: AC
  Administered 2015-02-04: 60 mg via EPIDURAL

## 2015-02-04 MED ORDER — IOHEXOL 300 MG/ML  SOLN: 1.0000 mL | Freq: Once | INTRAMUSCULAR | Status: DC | PRN

## 2015-02-04 NOTE — Discharge Instructions (Signed)

## 2017-04-29 ENCOUNTER — Ambulatory Visit
Admission: EM | Admit: 2017-04-29 | Discharge: 2017-04-29 | Disposition: A | Discharge status: Home / Self Care | Payer: Medicare Other | Attending: Family Medicine | Admitting: Family Medicine

## 2017-04-29 ENCOUNTER — Encounter: Payer: Self-pay | Admitting: Emergency Medicine

## 2017-04-29 ENCOUNTER — Other Ambulatory Visit: Payer: Self-pay

## 2017-04-29 ENCOUNTER — Ambulatory Visit: Payer: Medicare Other

## 2017-04-29 DIAGNOSIS — Z79899 Other long term (current) drug therapy: Secondary | ICD-10-CM | POA: Insufficient documentation

## 2017-04-29 DIAGNOSIS — I10 Essential (primary) hypertension: Secondary | ICD-10-CM | POA: Insufficient documentation

## 2017-04-29 DIAGNOSIS — E079 Disorder of thyroid, unspecified: Secondary | ICD-10-CM | POA: Diagnosis not present

## 2017-04-29 DIAGNOSIS — F329 Major depressive disorder, single episode, unspecified: Secondary | ICD-10-CM | POA: Diagnosis not present

## 2017-04-29 DIAGNOSIS — M5137 Other intervertebral disc degeneration, lumbosacral region: Secondary | ICD-10-CM | POA: Insufficient documentation

## 2017-04-29 DIAGNOSIS — E785 Hyperlipidemia, unspecified: Secondary | ICD-10-CM | POA: Insufficient documentation

## 2017-04-29 DIAGNOSIS — Z7982 Long term (current) use of aspirin: Secondary | ICD-10-CM | POA: Insufficient documentation

## 2017-04-29 DIAGNOSIS — R05 Cough: Secondary | ICD-10-CM | POA: Diagnosis present

## 2017-04-29 DIAGNOSIS — J069 Acute upper respiratory infection, unspecified: Secondary | ICD-10-CM | POA: Diagnosis not present

## 2017-04-29 DIAGNOSIS — R0981 Nasal congestion: Secondary | ICD-10-CM | POA: Diagnosis present

## 2017-04-29 HISTORY — DX: Disorder of thyroid, unspecified: E07.9

## 2017-04-29 LAB — URINALYSIS, COMPLETE (UACMP) WITH MICROSCOPIC
Bilirubin Urine: NEGATIVE
Glucose, UA: NEGATIVE mg/dL
Ketones, ur: NEGATIVE mg/dL
Nitrite: NEGATIVE
Protein, ur: NEGATIVE mg/dL
Specific Gravity, Urine: 1.01 (ref 1.005–1.030)
pH: 7 (ref 5.0–8.0)

## 2017-04-29 MED ORDER — BENZONATATE 200 MG PO CAPS: ORAL_CAPSULE | ORAL | 0 refills | Status: DC

## 2017-04-29 MED ORDER — AZITHROMYCIN 250 MG PO TABS: 250.0000 mg | ORAL_TABLET | Freq: Every day | ORAL | 0 refills | Status: DC

## 2017-04-29 NOTE — ED Triage Notes (Signed)
Patient in today c/o 1 week history of nasal congestion and fatigue. Patient denies fever, but hasn't checked her temperature. Patient has tried an OTC medicine, but she doesn't know what it was. Patient states she is "mixed up today". After questioning patient, she admits that she is having urinary frequency, but denies dysuria.

## 2017-04-29 NOTE — ED Provider Notes (Signed)
[facility]    CSN: [id number] Arrival date & time: 04/29/17  1640     History   Chief Complaint Chief Complaint  Patient presents with  . Nasal Congestion    HPI [name] is a 83 y.o. female.   HPI  This is an 83 year old female who is very good health and active who presents with a one-week history of nasal congestion sore throat fatigue and a productive cough of white to yellow sputum.  Is not had any fever chills and her temperature today is 97.9.  She has been taking over-the-counter medications namely NyQuil but is not does seem to be helping.  In addition she has felt confused today which is very unusual for her according to her daughter.  Has been having some urinary frequency but no dysuria or urgency.  Seen by her primary care physician on 04/22/2017 rapid strep was performed which was negative.  He is continued on symptomatic treatment alone.      Past Medical History:  Diagnosis Date  . Hyperlipidemia   . Hypertension   . Thyroid disease     Patient Active Problem List   Diagnosis Date Noted  . Clinical depression 02/04/2015  . H/O fall 02/04/2015  . DDD (degenerative disc disease), lumbosacral 02/04/2015  . Disease of thyroid gland 02/04/2015  . Difficulty hearing 04/01/2013  . Allergic rhinitis, seasonal 04/01/2013  . DD (diverticular disease) 11/21/2012  . Ceruminosis 11/21/2012  . Generalized OA 11/21/2012  . Adult hypothyroidism 11/21/2012  . Menopausal and perimenopausal disorder 11/21/2012  . BP (high blood pressure) 04/18/2012    Past Surgical History:  Procedure Laterality Date  . BREAST SURGERY     benign tumor  . CHOLECYSTECTOMY    . DILATION AND CURETTAGE OF UTERUS     had several    OB History    No data available       Home Medications    Prior to Admission medications   Medication Sig Start Date End Date Taking? Authorizing Provider  aspirin EC 81 MG tablet Take 81 mg by mouth daily.   Yes [provider]  hydrochlorothiazide (MICROZIDE) 12.5 MG capsule Take 12.5 mg by mouth daily.   Yes [provider]  levothyroxine (SYNTHROID, LEVOTHROID) 50 MCG tablet Take 50 mcg by mouth daily before breakfast.   Yes [provider]  naproxen sodium (ALEVE) 220 MG tablet Take 220 mg by mouth.   Yes [provider]  azithromycin (ZITHROMAX) 250 MG tablet Take 1 tablet (250 mg total) by mouth daily. Take first 2 tablets together, then 1 every day until finished. 04/29/17   Lutricia Feiloemer, Teyton Pattillo P, PA-C  benzonatate (TESSALON) 200 MG capsule Take one cap TID PRN cough 04/29/17   Lutricia Feiloemer, Eleesha Purkey P, PA-C  cetirizine (ZYRTEC) 10 MG tablet Take 10 mg by mouth daily as needed for allergies.    [provider]  cloNIDine (CATAPRES) 0.1 MG tablet Take 0.1 mg by mouth daily as needed (take if systolic >190 or diastolic >100).    [provider]    Family History Family History  Problem Relation Age of Onset  . Hypertension Mother   . Stroke Mother   . Colon cancer Father   . Stroke Brother   . Hypertension Brother     Social History Social History   Tobacco Use  . Smoking status: Never Smoker  . Smokeless tobacco: Never Used  Substance Use Topics  . Alcohol use: Yes    Alcohol/week:  0.6 oz    Types: 1 Glasses of wine per week    Frequency: Never  . Drug use: Not on file     Allergies   Percocet [oxycodone-acetaminophen]   Review of Systems Review of Systems  Constitutional: Positive for activity change and fatigue. Negative for appetite change, chills and fever.  HENT: Positive for congestion, postnasal drip, rhinorrhea and sore throat.   Respiratory: Positive for cough. Negative for shortness of breath, wheezing and stridor.   All other systems reviewed and are negative.    Physical Exam Triage Vital Signs ED Triage Vitals  Enc Vitals Group     BP 04/29/17 1702 (!) 168/60     Pulse Rate 04/29/17 1702 (!) 58     Resp 04/29/17 1702 16      Temp 04/29/17 1702 97.9 F (36.6 C)     Temp Source 04/29/17 1702 Oral     SpO2 04/29/17 1702 97 %     Weight 04/29/17 1702 120 lb (54.4 kg)     Height 04/29/17 1702 5\' 1"  (1.549 m)     Head Circumference --      Peak Flow --      Pain Score 04/29/17 1703 0     Pain Loc --      Pain Edu? --      Excl. in GC? --    No data found.  Updated Vital Signs BP (!) 168/60 (BP Location: Left Arm)   Pulse (!) 58   Temp 97.9 F (36.6 C) (Oral)   Resp 16   Ht 5\' 1"  (1.549 m)   Wt 120 lb (54.4 kg)   SpO2 97%   BMI 22.67 kg/m   Visual Acuity Right Eye Distance:   Left Eye Distance:   Bilateral Distance:    Right Eye Near:   Left Eye Near:    Bilateral Near:     Physical Exam  Constitutional: She is oriented to person, place, and time. She appears well-developed and well-nourished. No distress.  HENT:  Head: Normocephalic.  Right Ear: External ear normal.  Left Ear: External ear normal.  Nose: Nose normal.  Mouth/Throat: Oropharynx is clear and moist. No oropharyngeal exudate.  Eyes: Pupils are equal, round, and reactive to light. Right eye exhibits no discharge. Left eye exhibits no discharge.  Neck: Normal range of motion.  Cardiovascular: Normal rate, regular rhythm and normal heart sounds.  Pulmonary/Chest: Effort normal and breath sounds normal.  Musculoskeletal: Normal range of motion.  Lymphadenopathy:    She has no cervical adenopathy.  Neurological: She is alert and oriented to person, place, and time.  Skin: Skin is warm and dry. She is not diaphoretic.  Psychiatric: She has a normal mood and affect. Her behavior is normal. Judgment and thought content normal.  Nursing note and vitals reviewed.    UC Treatments / Results  Labs (all labs ordered are listed, but only abnormal results are displayed) Labs Reviewed  URINALYSIS, COMPLETE (UACMP) WITH MICROSCOPIC - Abnormal; Notable for the following components:      Result Value   Hgb urine dipstick SMALL (*)     Leukocytes, UA SMALL (*)    Squamous Epithelial / LPF 0-5 (*)    Bacteria, UA RARE (*)    All other components within normal limits    EKG  EKG Interpretation None       Radiology Dg Chest 2 View  Result Date: 04/29/2017 CLINICAL DATA:  83 y/o  F; cough. EXAM: CHEST  2  VIEW COMPARISON:  09/20/2013 chest radiograph FINDINGS: Stable normal cardiac silhouette. Aortic atherosclerosis with calcification. Stable elevated right hemidiaphragm. Surgical sutures project over right hemidiaphragm. No pleural effusion or pneumothorax. Multiple chronic left lateral rib fractures. No acute osseous abnormality is evident. IMPRESSION: No acute pulmonary process identified. Electronically Signed   By: Mitzi HansenLance  Furusawa-Stratton M.D.   On: 04/29/2017 18:29    Procedures Procedures (including critical care time)  Medications Ordered in UC Medications - No data to display   Initial Impression / Assessment and Plan / UC Course  I have reviewed the triage vital signs and the nursing notes.  Pertinent labs & imaging results that were available during my care of the patient were reviewed by me and considered in my medical decision making (see chart for details).     Plan: 1. Test/x-ray results and diagnosis reviewed with patient 2. rx as per orders; risks, benefits, potential side effects reviewed with patient 3. Recommend supportive treatment with rest and hydration.  He does not appear confused tonight with conversation.  I have asked her daughter to not give her any more of NyQuil.  I will provide her with Jerilynn Somessalon Perles for the cough and will and will also her on a Z-Pak.  If she worsens told the daughter to make sure she goes to the emergency room immediately.  Somewhat at home to be with her as she improves. 4. F/u prn if symptoms worsen or don't improve   Final Clinical Impressions(s) / UC Diagnoses   Final diagnoses:  Upper respiratory tract infection, unspecified type    ED Discharge  Orders        Ordered    azithromycin (ZITHROMAX) 250 MG tablet  Daily     04/29/17 1849    benzonatate (TESSALON) 200 MG capsule     04/29/17 1849       Controlled Substance Prescriptions [place] Controlled Substance Registry consulted? Not Applicable   Lutricia FeilRoemer, Massiel Stipp P, PA-C 04/29/17 1901

## 2018-06-01 ENCOUNTER — Other Ambulatory Visit: Payer: Self-pay

## 2018-06-01 ENCOUNTER — Encounter: Payer: Self-pay | Admitting: Emergency Medicine

## 2018-06-01 ENCOUNTER — Ambulatory Visit
Admission: EM | Admit: 2018-06-01 | Discharge: 2018-06-01 | Disposition: A | Discharge status: Home / Self Care | Payer: Medicare Other | Attending: Emergency Medicine | Admitting: Emergency Medicine

## 2018-06-01 DIAGNOSIS — R059 Cough, unspecified: Secondary | ICD-10-CM

## 2018-06-01 DIAGNOSIS — J0100 Acute maxillary sinusitis, unspecified: Secondary | ICD-10-CM | POA: Insufficient documentation

## 2018-06-01 DIAGNOSIS — R05 Cough: Secondary | ICD-10-CM | POA: Insufficient documentation

## 2018-06-01 MED ORDER — BENZONATATE 100 MG PO CAPS: 100.0000 mg | ORAL_CAPSULE | Freq: Three times a day (TID) | ORAL | 0 refills | Status: DC | PRN

## 2018-06-01 MED ORDER — DOXYCYCLINE HYCLATE 100 MG PO CAPS: 100.0000 mg | ORAL_CAPSULE | Freq: Two times a day (BID) | ORAL | 0 refills | Status: DC

## 2018-06-01 NOTE — Discharge Instructions (Addendum)
Take medication as prescribed. Rest. Drink plenty of fluids.  ° °Follow up with your primary care physician this week as needed. Return to Urgent care for new or worsening concerns.  ° °

## 2018-06-01 NOTE — ED Provider Notes (Signed)
[facility] ____________________________________________  Time seen: Approximately 2:05 PM  I have reviewed the triage vital signs and the nursing notes.   HISTORY  Chief Complaint Cough (APPT)   HPI [name] is a 84 y.o. female presenting with daughter at bedside for evaluation of cough and congestion present for the last 3-4 weeks.  States initially it felt like she had a cold, but reports symptoms have continued.  States feels a lot of postnasal drainage at night leading to cough.  States cough does disrupt sleep.  Denies known fevers.  Having some sinus pain pressure currently mild.  Denies sore throat.  Continues to eat and drink well.  Denies accompanying chest pain, shortness of breath or abdominal pain.  Denies recent sickness.  Reports otherwise has been doing well.  Lynnea FerrierKlein, Bert J III, MD: PCP    Past Medical History:  Diagnosis Date  . Hyperlipidemia   . Hypertension   . Thyroid disease     Patient Active Problem List   Diagnosis Date Noted  . Clinical depression 02/04/2015  . H/O fall 02/04/2015  . DDD (degenerative disc disease), lumbosacral 02/04/2015  . Disease of thyroid gland 02/04/2015  . Difficulty hearing 04/01/2013  . Allergic rhinitis, seasonal 04/01/2013  . DD (diverticular disease) 11/21/2012  . Ceruminosis 11/21/2012  . Generalized OA 11/21/2012  . Adult hypothyroidism 11/21/2012  . Menopausal and perimenopausal disorder 11/21/2012  . BP (high blood pressure) 04/18/2012    Past Surgical History:  Procedure Laterality Date  . BREAST SURGERY     benign tumor  . CHOLECYSTECTOMY    . DILATION AND CURETTAGE OF UTERUS     had several     No current facility-administered medications for this encounter.   Current Outpatient Medications:  .  cloNIDine (CATAPRES) 0.1 MG tablet, Take 0.1 mg by mouth daily as needed (take if systolic >190 or diastolic >100)., Disp: , Rfl:  .  hydrochlorothiazide (MICROZIDE) 12.5 MG capsule, Take  12.5 mg by mouth daily., Disp: , Rfl:  .  benzonatate (TESSALON PERLES) 100 MG capsule, Take 1 capsule (100 mg total) by mouth 3 (three) times daily as needed for cough., Disp: 15 capsule, Rfl: 0 .  doxycycline (VIBRAMYCIN) 100 MG capsule, Take 1 capsule (100 mg total) by mouth 2 (two) times daily., Disp: 20 capsule, Rfl: 0 .  levothyroxine (SYNTHROID, LEVOTHROID) 50 MCG tablet, Take 50 mcg by mouth daily before breakfast., Disp: , Rfl:   Allergies Percocet [oxycodone-acetaminophen]  Family History  Problem Relation Age of Onset  . Hypertension Mother   . Stroke Mother   . Colon cancer Father   . Stroke Brother   . Hypertension Brother     Social History Social History   Tobacco Use  . Smoking status: Never Smoker  . Smokeless tobacco: Never Used  Substance Use Topics  . Alcohol use: Yes    Alcohol/week: 1.0 standard drinks    Types: 1 Glasses of wine per week    Frequency: Never  . Drug use: Not on file    Review of Systems Constitutional: No fever. ENT:as above.  Cardiovascular: Denies chest pain. Respiratory: Denies shortness of breath. Gastrointestinal: No abdominal pain.   Musculoskeletal: Negative for back pain. Skin: Negative for rash.   ____________________________________________   PHYSICAL EXAM:  VITAL SIGNS: ED Triage Vitals  Enc Vitals Group     BP 06/01/18 1257 (!) 143/64     Pulse Rate 06/01/18 1257 62     Resp 06/01/18 1257 18  Temp 06/01/18 1257 97.6 F (36.4 C)     Temp Source 06/01/18 1257 Oral     SpO2 06/01/18 1257 97 %     Weight 06/01/18 1255 120 lb (54.4 kg)     Height 06/01/18 1255 5\' 1"  (1.549 m)     Head Circumference --      Peak Flow --      Pain Score 06/01/18 1255 0     Pain Loc --      Pain Edu? --      Excl. in GC? --     Constitutional: Alert and oriented. Well appearing and in no acute distress. Eyes: Conjunctivae are normal.  Head: Atraumatic. moderate tenderness to palpation bilateral maxillary sinuses. Minimal  bilateral frontal sinus tenderness palpation.  No swelling. No erythema.   Ears: no erythema, normal TMs bilaterally.   Nose: nasal congestion with bilateral nasal turbinate erythema and edema.   Mouth/Throat: Mucous membranes are moist. Oropharynx non-erythematous.No tonsillar swelling or exudate.  Neck: No stridor.  No cervical spine tenderness to palpation. Hematological/Lymphatic/Immunilogical: No cervical lymphadenopathy. Cardiovascular: Normal rate, regular rhythm. Grossly normal heart sounds.  Good peripheral circulation. Respiratory: Normal respiratory effort.  No retractions. . No wheezes, rales or rhonchi. Good air movement.  Musculoskeletal: Steady gait. Neurologic:  Normal speech and language.  No gait instability. Skin:  Skin is warm, dry and intact. No rash noted. Psychiatric: Mood and affect are normal. Speech and behavior are normal.   ___________________________________________   LABS (all labs ordered are listed, but only abnormal results are displayed)  Labs Reviewed - No data to display   PROCEDURES Procedures   INITIAL IMPRESSION / ASSESSMENT AND PLAN / ED COURSE  Pertinent labs & imaging results that were available during my care of the patient were reviewed by me and considered in my medical decision making (see chart for details).  Well-appearing patient.  No acute distress.  Suspect recent viral off respiratory infection with secondary sinusitis.  Will treat with oral doxycycline and PRN Tessalon Perles.  Encourage rest, fluids, supportive care.Discussed indication, risks and benefits of medications with patient.  Discussed follow up with Primary care physician this week. Discussed follow up and return parameters including no resolution or any worsening concerns. Patient verbalized understanding and agreed to plan.   ____________________________________________   FINAL CLINICAL IMPRESSION(S) / ED DIAGNOSES  Final diagnoses:  Acute maxillary sinusitis,  recurrence not specified  Cough     ED Discharge Orders         Ordered    doxycycline (VIBRAMYCIN) 100 MG capsule  2 times daily     06/01/18 1359    benzonatate (TESSALON PERLES) 100 MG capsule  3 times daily PRN     06/01/18 1359           Note: This dictation was prepared with Dragon dictation along with smaller phrase technology. Any transcriptional errors that result from this process are unintentional.         Renford DillsMiller, Ersie Savino, NP 06/01/18 [id number]

## 2018-06-01 NOTE — ED Triage Notes (Signed)
Patient c/o productive cough x 1 month. No fever. Denies any other symptoms.

## 2018-07-30 ENCOUNTER — Ambulatory Visit
Admission: EM | Admit: 2018-07-30 | Discharge: 2018-07-30 | Disposition: A | Discharge status: Home / Self Care | Payer: Medicare Other | Attending: Family Medicine | Admitting: Family Medicine

## 2018-07-30 DIAGNOSIS — W19XXXA Unspecified fall, initial encounter: Secondary | ICD-10-CM | POA: Diagnosis not present

## 2018-07-30 DIAGNOSIS — W228XXA Striking against or struck by other objects, initial encounter: Secondary | ICD-10-CM | POA: Diagnosis not present

## 2018-07-30 DIAGNOSIS — I10 Essential (primary) hypertension: Secondary | ICD-10-CM

## 2018-07-30 DIAGNOSIS — Z9181 History of falling: Secondary | ICD-10-CM

## 2018-07-30 DIAGNOSIS — S0990XA Unspecified injury of head, initial encounter: Secondary | ICD-10-CM

## 2018-07-30 NOTE — ED Provider Notes (Signed)
[facility]    CSN: [id number] Arrival date & time: 07/30/18  1920     History   Chief Complaint Chief Complaint  Patient presents with  . Fall    HPI [name] is a 85 y.o. female.   85 yo female with a c/o a fall and head injury about an hour ago. Patient states she tripped on hit the top of her head again the bottom part of a metal railing. Denies loss of consciousness but complains of severe headache. Denies any vomiting, vision changes, numbness/tingling, one-sided weakness.   The history is provided by the patient.  Fall     Past Medical History:  Diagnosis Date  . Hyperlipidemia   . Hypertension   . Thyroid disease     Patient Active Problem List   Diagnosis Date Noted  . Clinical depression 02/04/2015  . H/O fall 02/04/2015  . DDD (degenerative disc disease), lumbosacral 02/04/2015  . Disease of thyroid gland 02/04/2015  . Difficulty hearing 04/01/2013  . Allergic rhinitis, seasonal 04/01/2013  . DD (diverticular disease) 11/21/2012  . Ceruminosis 11/21/2012  . Generalized OA 11/21/2012  . Adult hypothyroidism 11/21/2012  . Menopausal and perimenopausal disorder 11/21/2012  . BP (high blood pressure) 04/18/2012    Past Surgical History:  Procedure Laterality Date  . BREAST SURGERY     benign tumor  . CHOLECYSTECTOMY    . DILATION AND CURETTAGE OF UTERUS     had several    OB History   No obstetric history on file.      Home Medications    Prior to Admission medications   Medication Sig Start Date End Date Taking? Authorizing Provider  hydrochlorothiazide (MICROZIDE) 12.5 MG capsule Take 12.5 mg by mouth daily.   Yes [provider]  levothyroxine (SYNTHROID, LEVOTHROID) 50 MCG tablet Take 50 mcg by mouth daily before breakfast.   Yes [provider]  benzonatate (TESSALON PERLES) 100 MG capsule Take 1 capsule (100 mg total) by mouth 3 (three) times daily as needed for cough. 06/01/18   Renford Dills, NP   cloNIDine (CATAPRES) 0.1 MG tablet Take 0.1 mg by mouth daily as needed (take if systolic >190 or diastolic >100).    [provider]  doxycycline (VIBRAMYCIN) 100 MG capsule Take 1 capsule (100 mg total) by mouth 2 (two) times daily. 06/01/18   Renford Dills, NP    Family History Family History  Problem Relation Age of Onset  . Hypertension Mother   . Stroke Mother   . Colon cancer Father   . Stroke Brother   . Hypertension Brother     Social History Social History   Tobacco Use  . Smoking status: Never Smoker  . Smokeless tobacco: Never Used  Substance Use Topics  . Alcohol use: Yes    Alcohol/week: 1.0 standard drinks    Types: 1 Glasses of wine per week    Frequency: Never  . Drug use: Not on file     Allergies   Percocet [oxycodone-acetaminophen]   Review of Systems Review of Systems   Physical Exam Triage Vital Signs ED Triage Vitals  Enc Vitals Group     BP 07/30/18 1933 (!) 172/77     Pulse Rate 07/30/18 1933 (!) 58     Resp 07/30/18 1933 18     Temp 07/30/18 1933 97.8 F (36.6 C)     Temp Source 07/30/18 1933 Oral     SpO2 07/30/18 1933 96 %  Weight 07/30/18 1932 115 lb (52.2 kg)     Height 07/30/18 1932 5' (1.524 m)     Head Circumference --      Peak Flow --      Pain Score 07/30/18 1932 7     Pain Loc --      Pain Edu? --      Excl. in GC? --    No data found.  Updated Vital Signs BP (!) 172/77 (BP Location: Right Arm)   Pulse (!) 58   Temp 97.8 F (36.6 C) (Oral)   Resp 18   Ht 5' (1.524 m)   Wt 52.2 kg   SpO2 96%   BMI 22.46 kg/m   Visual Acuity Right Eye Distance:   Left Eye Distance:   Bilateral Distance:    Right Eye Near:   Left Eye Near:    Bilateral Near:     Physical Exam Vitals signs and nursing note reviewed.  Constitutional:      General: She is not in acute distress.    Appearance: She is not toxic-appearing or diaphoretic.  HENT:     Head: No raccoon eyes, Battle's sign or abrasion.      Comments: Scalp hematoma and severe tenderness to palpation on top of scalp Neurological:     General: No focal deficit present.     Mental Status: She is alert and oriented to person, place, and time.     Cranial Nerves: No cranial nerve deficit.     Sensory: No sensory deficit.     Motor: No weakness.     Coordination: Coordination normal.     Gait: Gait normal.     Deep Tendon Reflexes: Reflexes normal.      UC Treatments / Results  Labs (all labs ordered are listed, but only abnormal results are displayed) Labs Reviewed - No data to display  EKG None  Radiology No results found.  Procedures Procedures (including critical care time)  Medications Ordered in UC Medications - No data to display  Initial Impression / Assessment and Plan / UC Course  I have reviewed the triage vital signs and the nursing notes.  Pertinent labs & imaging results that were available during my care of the patient were reviewed by me and considered in my medical decision making (see chart for details).      Final Clinical Impressions(s) / UC Diagnoses   Final diagnoses:  Closed head injury, initial encounter  Fall, initial encounter     Discharge Instructions     Recommend patient go to Emergency Department for further evaluation and management    ED Prescriptions    None     1. Discussed possible complications and recommend patient go to Emergency Department for further evaluation  Controlled Substance Prescriptions [place] Controlled Substance Registry consulted? Not Applicable   Payton Mccallum, MD 07/30/18 2114

## 2018-07-30 NOTE — ED Triage Notes (Signed)
Pt states she [place] steps at [place] and hit her head on the rail. Denies double vision but having headache, pain and dizziness. States she didn't lose consciousness but did lay there a while. No bleeding but ice is applied to the back of her head.

## 2018-07-30 NOTE — Discharge Instructions (Signed)
Recommend patient go to Emergency Department for further evaluation and management °

## 2019-05-13 ENCOUNTER — Encounter: Payer: Self-pay | Admitting: Emergency Medicine

## 2019-05-13 ENCOUNTER — Ambulatory Visit
Admission: EM | Admit: 2019-05-13 | Discharge: 2019-05-13 | Disposition: A | Discharge status: Home / Self Care | Payer: Medicare Other | Attending: Family Medicine | Admitting: Family Medicine

## 2019-05-13 ENCOUNTER — Other Ambulatory Visit: Payer: Self-pay

## 2019-05-13 ENCOUNTER — Ambulatory Visit: Payer: Medicare Other

## 2019-05-13 DIAGNOSIS — W010XXA Fall on same level from slipping, tripping and stumbling without subsequent striking against object, initial encounter: Secondary | ICD-10-CM

## 2019-05-13 DIAGNOSIS — S60221A Contusion of right hand, initial encounter: Secondary | ICD-10-CM | POA: Insufficient documentation

## 2019-05-13 DIAGNOSIS — S61411A Laceration without foreign body of right hand, initial encounter: Secondary | ICD-10-CM | POA: Diagnosis not present

## 2019-05-13 MED ORDER — MUPIROCIN 2 % EX OINT: TOPICAL_OINTMENT | CUTANEOUS | 0 refills | Status: DC

## 2019-05-13 MED ORDER — CEPHALEXIN 500 MG PO CAPS: 500.0000 mg | ORAL_CAPSULE | Freq: Three times a day (TID) | ORAL | 0 refills | Status: AC

## 2019-05-13 NOTE — Discharge Instructions (Signed)
Take medication as prescribed.  Keep clean and dry.  Monitor.  Follow up with your primary care physician this week as needed. Return to Urgent care for new or worsening concerns.   

## 2019-05-13 NOTE — ED Triage Notes (Signed)
Patient states she fell in the driveway yesterday before lunch time. She injured her right hand, and pinky finger.

## 2019-05-13 NOTE — ED Provider Notes (Signed)
[facility] ____________________________________________  Time seen: Approximately 10:04 AM  I have reviewed the triage vital signs and the nursing notes.   HISTORY  Chief Complaint Hand Injury  HPI [name] is a 85 y.o. female presenting with family at bedside for evaluation of right hand injury with laceration.  Reports yesterday she was walking back to her house from dropping up plants by the road and tripped.  States that she fell forward catching herself with her right hand and did hit right knee.  Denies any pain.  States pain to right lateral hand as well as laceration.  Did clean the laceration with peroxide and apply antibiotic ointment.  States tetanus immunization is within the last 10 years.  States pain is mild.  No paresthesias or decreased range of motion.  Reports baseline severe osteoarthritis.  Denies chest pain, shortness of breath, syncope, head injury or other complaints.  States feels well. States fell only due to tripping.  Denies renal insufficiency.   Lynnea Ferrier, MD : PCP   Past Medical History:  Diagnosis Date  . Hyperlipidemia   . Hypertension   . Thyroid disease     Patient Active Problem List   Diagnosis Date Noted  . Clinical depression 02/04/2015  . H/O fall 02/04/2015  . DDD (degenerative disc disease), lumbosacral 02/04/2015  . Disease of thyroid gland 02/04/2015  . Difficulty hearing 04/01/2013  . Allergic rhinitis, seasonal 04/01/2013  . DD (diverticular disease) 11/21/2012  . Ceruminosis 11/21/2012  . Generalized OA 11/21/2012  . Adult hypothyroidism 11/21/2012  . Menopausal and perimenopausal disorder 11/21/2012  . BP (high blood pressure) 04/18/2012    Past Surgical History:  Procedure Laterality Date  . BREAST SURGERY     benign tumor  . CHOLECYSTECTOMY    . DILATION AND CURETTAGE OF UTERUS     had several     No current facility-administered medications for this encounter.   Current Outpatient  Medications:  .  cloNIDine (CATAPRES) 0.1 MG tablet, Take 0.1 mg by mouth daily as needed (take if systolic >190 or diastolic >100)., Disp: , Rfl:  .  hydrochlorothiazide (MICROZIDE) 12.5 MG capsule, Take 12.5 mg by mouth daily., Disp: , Rfl:  .  levothyroxine (SYNTHROID, LEVOTHROID) 50 MCG tablet, Take 50 mcg by mouth daily before breakfast., Disp: , Rfl:  .  cephALEXin (KEFLEX) 500 MG capsule, Take 1 capsule (500 mg total) by mouth 3 (three) times daily for 5 days., Disp: 15 capsule, Rfl: 0 .  mupirocin ointment (BACTROBAN) 2 %, Apply two times a day for 5 days., Disp: 22 g, Rfl: 0  Allergies Percocet [oxycodone-acetaminophen]  Family History  Problem Relation Age of Onset  . Hypertension Mother   . Stroke Mother   . Colon cancer Father   . Stroke Brother   . Hypertension Brother     Social History Social History   Tobacco Use  . Smoking status: Never Smoker  . Smokeless tobacco: Never Used  Substance Use Topics  . Alcohol use: Yes    Alcohol/week: 1.0 standard drinks    Types: 1 Glasses of wine per week    Frequency: Never  . Drug use: Not on file    Review of Systems Constitutional: No fever ENT: No sore throat. Cardiovascular: Denies chest pain. Respiratory: Denies shortness of breath. Gastrointestinal: No abdominal pain.   Musculoskeletal: Negative for back pain. Skin: Positive laceration. Neurological: Negative for headaches, focal weakness or numbness.  ____________________________________________   PHYSICAL EXAM:  VITAL  SIGNS: ED Triage Vitals  Enc Vitals Group     BP 05/13/19 0945 (!) 145/64     Pulse Rate 05/13/19 0945 60     Resp 05/13/19 0945 18     Temp 05/13/19 0945 98.1 F (36.7 C)     Temp Source 05/13/19 0945 Oral     SpO2 05/13/19 0945 98 %     Weight 05/13/19 0947 120 lb (54.4 kg)     Height 05/13/19 0947 5' (1.524 m)     Head Circumference --      Peak Flow --      Pain Score 05/13/19 0946 0     Pain Loc --      Pain Edu? --       Excl. in GC? --     Constitutional: Alert and oriented. Well appearing and in no acute distress. Eyes: Conjunctivae are normal. ENT      Head: Normocephalic and atraumatic. Cardiovascular: Normal rate, regular rhythm. Grossly normal heart sounds.  Good peripheral circulation. Respiratory: Normal respiratory effort without tachypnea nor retractions. Breath sounds are clear and equal bilaterally. No wheezes, rales, rhonchi. Musculoskeletal:Steady gait.  Except: Right hand proximal fifth phalanx through fifth metacarpal as well as mild to fourth tenderness to direct palpation, 2 cm laceration present at the base of the fifth proximal phalanx, no active bleeding, no erythema, no foreign body noted, well approximated and mildly tender.  Good resisted distal flexion extension to right hand distal fingers.  Right upper semiotherwise nontender. Neurologic:  Normal speech and language. No gross focal neurologic deficits are appreciated. Speech is normal. No gait instability.  Skin:  Skin is warm, dry. As above.  Psychiatric: Mood and affect are normal. Speech and behavior are normal. Patient exhibits appropriate insight and judgment   ___________________________________________   LABS (all labs ordered are listed, but only abnormal results are displayed)  Labs Reviewed - No data to display  RADIOLOGY  Dg Hand Complete Right  Result Date: 05/13/2019 CLINICAL DATA:  Lateral hand pain, status post EXAM: RIGHT HAND - COMPLETE 3+ VIEW COMPARISON:  None. FINDINGS: No acute fracture or dislocation is seen. There is advanced first CMC joint osteoarthritis with slight radial subluxation the first metacarpal. There is osteoarthritis seen at the PIP and DIP joints with marginal osteophyte formation. There is diffuse osteopenia. IMPRESSION: No acute osseous abnormality. Electronically Signed   By: Jonna ClarkBindu  Avutu M.D.   On: 05/13/2019 10:41   ____________________________________________   PROCEDURES  Procedures   Procedure(s) performed:  Procedure explained and verbal consent obtained. Consent: Verbal consent obtained. Written consent not obtained. Risks and benefits: risks, benefits and alternatives were discussed Patient identity confirmed: verbally with patient and hospital-assigned identification number  Consent given by: patient   Laceration Repair Location: right hand Length: 2cm Foreign bodies: no foreign bodies Tendon involvement: none Nerve involvement: none Preparation: Patient was prepped and draped in the usual sterile fashion. Anesthesia: none Cleaned with Betadine Irrigation solution: saline Irrigation method: jet lavage Amount of cleaning: copious Repaired with Steri-Strips x 2 Patient tolerate well. Wound well approximated post repair.  Antibiotic ointment and dressing applied.  Wound care instructions provided.  Observe for any signs of infection or other problems.     INITIAL IMPRESSION / ASSESSMENT AND PLAN / ED COURSE  Pertinent labs & imaging results that were available during my care of the patient were reviewed by me and considered in my medical decision making (see chart for details).  Well-appearing patient.  No acute distress.  Family at bedside.  Right hand pain after injury that occurred yesterday, reports mechanical fall.  Laceration present greater than 12 hours old, well-appearing, cleaned, Steri-Strips.  Will empirically place on oral Keflex topical Bactroban..  X-ray negative.  Discussed wound care. Discussed indication, risks and benefits of medications with patient and family.   Discussed follow up with Primary care physician this week as needed.  Discussed follow up and return parameters including no resolution or any worsening concerns. Patient verbalized understanding and agreed to plan.   ____________________________________________   FINAL CLINICAL IMPRESSION(S) / ED DIAGNOSES  Final diagnoses:  Contusion of right hand, initial  encounter  Laceration of right hand without foreign body, initial encounter     ED Discharge Orders         Ordered    cephALEXin (KEFLEX) 500 MG capsule  3 times daily     05/13/19 1032    mupirocin ointment (BACTROBAN) 2 %     05/13/19 1032           Note: This dictation was prepared with Dragon dictation along with smaller phrase technology. Any transcriptional errors that result from this process are unintentional.         Marylene Land, NP 05/13/19 1228

## 2019-10-02 ENCOUNTER — Other Ambulatory Visit: Payer: Self-pay | Admitting: Orthopedic Surgery

## 2019-10-02 DIAGNOSIS — T84030A Mechanical loosening of internal right hip prosthetic joint, initial encounter: Secondary | ICD-10-CM

## 2019-10-09 ENCOUNTER — Encounter (HOSPITAL_COMMUNITY): Payer: Medicare Other

## 2019-10-09 ENCOUNTER — Other Ambulatory Visit (HOSPITAL_COMMUNITY): Payer: Medicare Other

## 2019-10-14 ENCOUNTER — Other Ambulatory Visit: Payer: Self-pay

## 2019-10-14 ENCOUNTER — Encounter
Admission: RE | Admit: 2019-10-14 | Discharge: 2019-10-14 | Disposition: A | Discharge status: Home / Self Care | Payer: Medicare Other | Source: Ambulatory Visit | Attending: Orthopedic Surgery | Admitting: Orthopedic Surgery

## 2019-10-14 DIAGNOSIS — T84030A Mechanical loosening of internal right hip prosthetic joint, initial encounter: Secondary | ICD-10-CM | POA: Insufficient documentation

## 2019-10-14 MED ORDER — TECHNETIUM TC 99M MEDRONATE IV KIT
20.0000 | PACK | Freq: Once | INTRAVENOUS | Status: AC | PRN
  Administered 2019-10-14: 22.65 via INTRAVENOUS

## 2020-05-03 ENCOUNTER — Other Ambulatory Visit: Payer: Self-pay

## 2020-05-03 ENCOUNTER — Ambulatory Visit
Admission: EM | Admit: 2020-05-03 | Discharge: 2020-05-03 | Disposition: A | Discharge status: Home / Self Care | Payer: Medicare Other | Attending: Family Medicine | Admitting: Family Medicine

## 2020-05-03 DIAGNOSIS — N3001 Acute cystitis with hematuria: Secondary | ICD-10-CM | POA: Diagnosis present

## 2020-05-03 LAB — URINALYSIS, COMPLETE (UACMP) WITH MICROSCOPIC
Bilirubin Urine: NEGATIVE
Glucose, UA: NEGATIVE mg/dL
Nitrite: NEGATIVE
Protein, ur: NEGATIVE mg/dL
Specific Gravity, Urine: 1.025 (ref 1.005–1.030)
pH: 6 (ref 5.0–8.0)

## 2020-05-03 MED ORDER — CEPHALEXIN 500 MG PO CAPS: 500.0000 mg | ORAL_CAPSULE | Freq: Two times a day (BID) | ORAL | 0 refills | Status: DC

## 2020-05-03 NOTE — ED Triage Notes (Signed)
Patient states that she noticed blood in her urine this morning. States that she has been noticing some throbbing after she uses the bathroom and going more frequently.

## 2020-05-03 NOTE — ED Provider Notes (Signed)
[facility]    CSN: [id number] Arrival date & time: 05/03/20  1649      History   Chief Complaint Chief Complaint  Patient presents with  . Urinary Frequency   HPI 86 year old female presents for evaluation of the above.  Patient reports recent development of urinary frequency.  She has now developed a throbbing pain when she urinates.  She noticed some blood in her urine this morning.  Concern for UTI.  Daughter reports that she has reported some lightheadedness/dizziness.  No fever.  Denies abdominal pain.  No back pain or flank pain.  No relieving factors.  Past Medical History:  Diagnosis Date  . Hyperlipidemia   . Hypertension   . Thyroid disease     Patient Active Problem List   Diagnosis Date Noted  . Clinical depression 02/04/2015  . H/O fall 02/04/2015  . DDD (degenerative disc disease), lumbosacral 02/04/2015  . Disease of thyroid gland 02/04/2015  . Difficulty hearing 04/01/2013  . Allergic rhinitis, seasonal 04/01/2013  . DD (diverticular disease) 11/21/2012  . Ceruminosis 11/21/2012  . Generalized OA 11/21/2012  . Adult hypothyroidism 11/21/2012  . Menopausal and perimenopausal disorder 11/21/2012  . BP (high blood pressure) 04/18/2012    Past Surgical History:  Procedure Laterality Date  . BREAST SURGERY     benign tumor  . CHOLECYSTECTOMY    . DILATION AND CURETTAGE OF UTERUS     had several    OB History   No obstetric history on file.      Home Medications    Prior to Admission medications   Medication Sig Start Date End Date Taking? Authorizing Provider  cloNIDine (CATAPRES) 0.1 MG tablet Take 0.1 mg by mouth daily as needed (take if systolic >190 or diastolic >100).   Yes [provider]  hydrochlorothiazide (MICROZIDE) 12.5 MG capsule Take 12.5 mg by mouth daily.   Yes [provider]  levothyroxine (SYNTHROID, LEVOTHROID) 50 MCG tablet Take 50 mcg by mouth daily before breakfast.   Yes [provider]  mupirocin ointment (BACTROBAN) 2 % Apply two times a day for 5 days. 05/13/19   Renford Dills, NP    Family History Family History  Problem Relation Age of Onset  . Hypertension Mother   . Stroke Mother   . Colon cancer Father   . Stroke Brother   . Hypertension Brother     Social History Social History   Tobacco Use  . Smoking status: Never Smoker  . Smokeless tobacco: Never Used  Vaping Use  . Vaping Use: Never used  Substance Use Topics  . Alcohol use: Yes    Alcohol/week: 1.0 standard drink    Types: 1 Glasses of wine per week  . Drug use: Not on file     Allergies   Percocet [oxycodone-acetaminophen]   Review of Systems Review of Systems  Constitutional: Negative for fever.  Genitourinary: Positive for dysuria and frequency.   Physical Exam Triage Vital Signs ED Triage Vitals [05/03/20 1755]  Enc Vitals Group     BP (!) 177/112     Pulse Rate 63     Resp 18     Temp 98.1 F (36.7 C)     Temp Source Oral     SpO2 97 %     Weight 118 lb (53.5 kg)     Height 5' (1.524 m)     Head Circumference      Peak Flow      Pain Score 1  Pain Loc      Pain Edu?      Excl. in GC?    Updated Vital Signs BP (!) 177/112 (BP Location: Left Arm)   Pulse 63   Temp 98.1 F (36.7 C) (Oral)   Resp 18   Ht 5' (1.524 m)   Wt 53.5 kg   SpO2 97%   BMI 23.05 kg/m   Visual Acuity Right Eye Distance:   Left Eye Distance:   Bilateral Distance:    Right Eye Near:   Left Eye Near:    Bilateral Near:     Physical Exam Constitutional:      General: She is not in acute distress.    Appearance: Normal appearance. She is not ill-appearing.  HENT:     Head: Normocephalic and atraumatic.  Cardiovascular:     Rate and Rhythm: Normal rate and regular rhythm.     Heart sounds: Murmur heard.   Pulmonary:     Effort: Pulmonary effort is normal.     Breath sounds: Normal breath sounds. No wheezing or rales.  Abdominal:     General: There is no  distension.     Palpations: Abdomen is soft.     Tenderness: There is no abdominal tenderness.  Neurological:     Mental Status: She is alert.  Psychiatric:        Mood and Affect: Mood normal.        Behavior: Behavior normal.    UC Treatments / Results  Labs (all labs ordered are listed, but only abnormal results are displayed) Labs Reviewed  URINALYSIS, COMPLETE (UACMP) WITH MICROSCOPIC - Abnormal; Notable for the following components:      Result Value   Hgb urine dipstick MODERATE (*)    Ketones, ur TRACE (*)    Leukocytes,Ua SMALL (*)    Bacteria, UA FEW (*)    All other components within normal limits    EKG   Radiology No results found.  Procedures Procedures (including critical care time)  Medications Ordered in UC Medications - No data to display  Initial Impression / Assessment and Plan / UC Course  I have reviewed the triage vital signs and the nursing notes.  Pertinent labs & imaging results that were available during my care of the patient were reviewed by me and considered in my medical decision making (see chart for details).    86 year old female presents with UTI.  Treating with Keflex.  Final Clinical Impressions(s) / UC Diagnoses   Final diagnoses:  Acute cystitis with hematuria   Discharge Instructions   None    ED Prescriptions    None     PDMP not reviewed this encounter.   Tommie Sams, [place] 05/03/20 1842

## 2020-05-03 NOTE — Discharge Instructions (Signed)
Antibiotic twice daily as prescribed. ° °Take care ° °Dr. Adrianah Prophete  °

## 2020-05-06 LAB — URINE CULTURE: Culture: 50000 — AB

## 2020-05-09 ENCOUNTER — Ambulatory Visit: Payer: Self-pay

## 2020-05-09 ENCOUNTER — Encounter: Payer: Self-pay | Admitting: Emergency Medicine

## 2020-05-09 ENCOUNTER — Ambulatory Visit
Admission: EM | Admit: 2020-05-09 | Discharge: 2020-05-09 | Disposition: A | Discharge status: Home / Self Care | Payer: Medicare Other | Attending: Emergency Medicine | Admitting: Emergency Medicine

## 2020-05-09 ENCOUNTER — Other Ambulatory Visit: Payer: Self-pay

## 2020-05-09 DIAGNOSIS — S81812A Laceration without foreign body, left lower leg, initial encounter: Secondary | ICD-10-CM

## 2020-05-09 MED ORDER — CEPHALEXIN 500 MG PO CAPS: 500.0000 mg | ORAL_CAPSULE | Freq: Three times a day (TID) | ORAL | 0 refills | Status: AC

## 2020-05-09 NOTE — Discharge Instructions (Addendum)
Keep the wound clean and dry.  Change the dressing daily, or if it becomes soiled, and use a nonadherent dressing, such as Telfa, and secure with Coban.  Would increase your Keflex to 3 times a day and extended for another 7 days to prevent infection.  Return if you have any increase in swelling, redness, drainage from the wound, or if you start running a fever.

## 2020-05-09 NOTE — ED Provider Notes (Signed)
[facility]    CSN: [id number] Arrival date & time: 05/09/20  1742      History   Chief Complaint Chief Complaint  Patient presents with  . Leg Injury    DOI 05/09/20  . skin tear    HPI [name] is a 86 y.o. female.   HPI  62-year-old female here for evaluation of left leg injury.  Patient is here for evaluation of skin tear she sustained to her left lower leg while getting out of her daughter's car today.  She reports that she got hit by the edge of the car door cause injury.  There is no active bleeding from the wound.  Patient is tetanus was updated in 2017.  Injury occurred around 5:00 this afternoon. Past Medical History:  Diagnosis Date  . Hyperlipidemia   . Hypertension   . Thyroid disease     Patient Active Problem List   Diagnosis Date Noted  . Clinical depression 02/04/2015  . H/O fall 02/04/2015  . DDD (degenerative disc disease), lumbosacral 02/04/2015  . Disease of thyroid gland 02/04/2015  . Difficulty hearing 04/01/2013  . Allergic rhinitis, seasonal 04/01/2013  . DD (diverticular disease) 11/21/2012  . Ceruminosis 11/21/2012  . Generalized OA 11/21/2012  . Adult hypothyroidism 11/21/2012  . Menopausal and perimenopausal disorder 11/21/2012  . BP (high blood pressure) 04/18/2012    Past Surgical History:  Procedure Laterality Date  . BREAST SURGERY     benign tumor  . CHOLECYSTECTOMY    . DILATION AND CURETTAGE OF UTERUS     had several    OB History   No obstetric history on file.      Home Medications    Prior to Admission medications   Medication Sig Start Date End Date Taking? Authorizing Provider  cloNIDine (CATAPRES) 0.1 MG tablet Take 0.1 mg by mouth daily as needed (take if systolic >190 or diastolic >100).   Yes [provider]  hydrochlorothiazide (MICROZIDE) 12.5 MG capsule Take 12.5 mg by mouth daily.   Yes [provider]  levothyroxine (SYNTHROID, LEVOTHROID) 50 MCG tablet Take 50 mcg  by mouth daily before breakfast.   Yes [provider]  cephALEXin (KEFLEX) 500 MG capsule Take 1 capsule (500 mg total) by mouth 3 (three) times daily for 7 days. 05/09/20 05/16/20  Becky Augusta, NP    Family History Family History  Problem Relation Age of Onset  . Hypertension Mother   . Stroke Mother   . Colon cancer Father   . Stroke Brother   . Hypertension Brother     Social History Social History   Tobacco Use  . Smoking status: Never Smoker  . Smokeless tobacco: Never Used  Vaping Use  . Vaping Use: Never used  Substance Use Topics  . Alcohol use: Yes    Alcohol/week: 1.0 standard drink    Types: 1 Glasses of wine per week  . Drug use: Never     Allergies   Oxycodone-acetaminophen and Percocet [oxycodone-acetaminophen]   Review of Systems Review of Systems  Constitutional: Negative for activity change, appetite change and fever.  Musculoskeletal: Negative for arthralgias and myalgias.  Skin: Positive for color change and wound.  Neurological: Negative for weakness and numbness.  Hematological: Negative.   Psychiatric/Behavioral: Negative.      Physical Exam Triage Vital Signs ED Triage Vitals  Enc Vitals Group     BP 05/09/20 1845 (!) 161/74     Pulse Rate 05/09/20 1845 60  Resp 05/09/20 1845 18     Temp 05/09/20 1845 98.2 F (36.8 C)     Temp Source 05/09/20 1845 Oral     SpO2 05/09/20 1845 97 %     Weight 05/09/20 1846 120 lb (54.4 kg)     Height 05/09/20 1846 5' (1.524 m)     Head Circumference --      Peak Flow --      Pain Score 05/09/20 1844 5     Pain Loc --      Pain Edu? --      Excl. in GC? --    No data found.  Updated Vital Signs BP (!) 161/74 (BP Location: Left Arm)   Pulse 60   Temp 98.2 F (36.8 C) (Oral)   Resp 18   Ht 5' (1.524 m)   Wt 120 lb (54.4 kg)   SpO2 97%   BMI 23.44 kg/m   Visual Acuity Right Eye Distance:   Left Eye Distance:   Bilateral Distance:    Right Eye Near:   Left Eye Near:      Bilateral Near:     Physical Exam Vitals and nursing note reviewed.  Constitutional:      General: She is not in acute distress.    Appearance: Normal appearance.  HENT:     Head: Normocephalic and atraumatic.  Eyes:     General: No scleral icterus.    Extraocular Movements: Extraocular movements intact.     Conjunctiva/sclera: Conjunctivae normal.     Pupils: Pupils are equal, round, and reactive to light.  Cardiovascular:     Rate and Rhythm: Normal rate and regular rhythm.     Pulses: Normal pulses.     Heart sounds: Normal heart sounds. No murmur heard.  No gallop.   Pulmonary:     Effort: Pulmonary effort is normal.     Breath sounds: Normal breath sounds. No wheezing, rhonchi or rales.  Musculoskeletal:        General: Tenderness and signs of injury present. No swelling. Normal range of motion.  Skin:    General: Skin is warm and dry.     Capillary Refill: Capillary refill takes less than 2 seconds.     Findings: No erythema or rash.  Neurological:     General: No focal deficit present.     Mental Status: She is alert and oriented to person, place, and time.  Psychiatric:        Mood and Affect: Mood normal.        Behavior: Behavior normal.        Thought Content: Thought content normal.        Judgment: Judgment normal.      UC Treatments / Results  Labs (all labs ordered are listed, but only abnormal results are displayed) Labs Reviewed - No data to display  EKG   Radiology No results found.  Procedures Procedures (including critical care time)  Medications Ordered in UC Medications - No data to display  Initial Impression / Assessment and Plan / UC Course  I have reviewed the triage vital signs and the nursing notes.  Pertinent labs & imaging results that were available during my care of the patient were reviewed by me and considered in my medical decision making (see chart for details).   Patient is here for evaluation of a wound to her left  leg.  Patient has a large, silver dollar size skin tear in the middle of the lateral left  lower leg.  There is no active bleeding from the wound.  No drainage or redness.  Will treat with nonadherent dressing and cover with Keflex for prophylaxis.   Final Clinical Impressions(s) / UC Diagnoses   Final diagnoses:  Noninfected skin tear of leg, left, initial encounter     Discharge Instructions     Keep the wound clean and dry.  Change the dressing daily, or if it becomes soiled, and use a nonadherent dressing, such as Telfa, and secure with Coban.  Would increase your Keflex to 3 times a day and extended for another 7 days to prevent infection.  Return if you have any increase in swelling, redness, drainage from the wound, or if you start running a fever.    ED Prescriptions    Medication Sig Dispense Auth. Provider   cephALEXin (KEFLEX) 500 MG capsule Take 1 capsule (500 mg total) by mouth 3 (three) times daily for 7 days. 14 capsule Becky Augusta, NP     PDMP not reviewed this encounter.   Becky Augusta, NP 05/09/20 1907

## 2020-05-09 NOTE — ED Triage Notes (Signed)
Patient in today with her daughter c/o left leg injury that occurred at ~5pm today. Daughter states patient scraped her leg against the car door. Patient's last Tdap was 08/02/15 per care everywhere.

## 2021-01-04 ENCOUNTER — Ambulatory Visit: Payer: Self-pay

## 2021-01-05 ENCOUNTER — Ambulatory Visit (INDEPENDENT_AMBULATORY_CARE_PROVIDER_SITE_OTHER): Payer: Medicare Other

## 2021-01-05 ENCOUNTER — Ambulatory Visit
Admission: RE | Admit: 2021-01-05 | Discharge: 2021-01-05 | Disposition: A | Discharge status: Home / Self Care | Payer: Medicare Other | Source: Ambulatory Visit | Attending: Family | Admitting: Family

## 2021-01-05 ENCOUNTER — Other Ambulatory Visit: Payer: Self-pay

## 2021-01-05 VITALS — BP 154/76 | HR 65 | Temp 99.3°F | Resp 18

## 2021-01-05 DIAGNOSIS — R059 Cough, unspecified: Secondary | ICD-10-CM | POA: Diagnosis present

## 2021-01-05 DIAGNOSIS — R0602 Shortness of breath: Secondary | ICD-10-CM

## 2021-01-05 DIAGNOSIS — J029 Acute pharyngitis, unspecified: Secondary | ICD-10-CM | POA: Diagnosis present

## 2021-01-05 DIAGNOSIS — R062 Wheezing: Secondary | ICD-10-CM | POA: Insufficient documentation

## 2021-01-05 DIAGNOSIS — Z20822 Contact with and (suspected) exposure to covid-19: Secondary | ICD-10-CM | POA: Diagnosis not present

## 2021-01-05 MED ORDER — BENZONATATE 100 MG PO CAPS: 100.0000 mg | ORAL_CAPSULE | Freq: Three times a day (TID) | ORAL | 0 refills | Status: DC | PRN

## 2021-01-05 NOTE — ED Triage Notes (Signed)
PT reports sore throat and cough that started Saturday. PT went to the beach last week. Had a negative home COVID yesterday.

## 2021-01-05 NOTE — Discharge Instructions (Addendum)
Recommend start Tessalon cough pills 1 every 8 hours as needed. May take Tylenol 1000mg  every 8 hours as needed for throat pain. May also try OTC Delsym 2 teaspoons every 12 hours as needed for cough. Continue to push fluids to help loosen up any mucus in chest. Follow-up pending COVID 19 test results and with her PCP in 3 to 4 days if not improving.

## 2021-01-05 NOTE — ED Provider Notes (Signed)
[facility]    CSN: [id number] Arrival date & time: 01/05/21  1339      History   Chief Complaint Chief Complaint  Patient presents with   Appointment    Sore throat, cough    HPI [name] is a 87 y.o. female.   87 year old female accompanied by her daughter with concern over cough and sore throat. Was at the [facility] last week and started with a cough and irritated throat about 4 to 5 days ago. Voice is more hoarse and throat hurts when swallowing or coughing. Denies any distinct fever, nasal congestion, chills, nausea or vomiting. Cough worse at night and having a hard time sleeping. Has been gargling with salt water with some success. Daughter gave her Coricidin cough and cold medication and patient got dizzy and almost passed out today. No known exposure to COVID 19. Has been vaccinated. Took home COVID 19 test yesterday with negative result. Other chronic health issues include HTN, arthritis and thyroid disorder. Currently on HCTZ and Synthroid daily and Catapress prn.   The history is provided by the patient and a caregiver.   Past Medical History:  Diagnosis Date   Hyperlipidemia    Hypertension    Thyroid disease     Patient Active Problem List   Diagnosis Date Noted   Clinical depression 02/04/2015   H/O fall 02/04/2015   DDD (degenerative disc disease), lumbosacral 02/04/2015   Disease of thyroid gland 02/04/2015   Difficulty hearing 04/01/2013   Allergic rhinitis, seasonal 04/01/2013   DD (diverticular disease) 11/21/2012   Ceruminosis 11/21/2012   Generalized OA 11/21/2012   Adult hypothyroidism 11/21/2012   Menopausal and perimenopausal disorder 11/21/2012   BP (high blood pressure) 04/18/2012    Past Surgical History:  Procedure Laterality Date   BREAST SURGERY     benign tumor   CHOLECYSTECTOMY     DILATION AND CURETTAGE OF UTERUS     had several    OB History   No obstetric history on file.      Home Medications    Prior to  Admission medications   Medication Sig Start Date End Date Taking? Authorizing Provider  benzonatate (TESSALON) 100 MG capsule Take 1 capsule (100 mg total) by mouth 3 (three) times daily as needed for cough. 01/05/21  Yes Sarah Baez, Ali Lowe, NP  hydrochlorothiazide (MICROZIDE) 12.5 MG capsule Take 12.5 mg by mouth daily.   Yes [provider]  levothyroxine (SYNTHROID, LEVOTHROID) 50 MCG tablet Take 50 mcg by mouth daily before breakfast.   Yes [provider]  cloNIDine (CATAPRES) 0.1 MG tablet Take 0.1 mg by mouth daily as needed (take if systolic >190 or diastolic >100).    [provider]    Family History Family History  Problem Relation Age of Onset   Hypertension Mother    Stroke Mother    Colon cancer Father    Stroke Brother    Hypertension Brother     Social History Social History   Tobacco Use   Smoking status: Never   Smokeless tobacco: Never  Vaping Use   Vaping Use: Never used  Substance Use Topics   Alcohol use: Yes    Alcohol/week: 1.0 standard drink    Types: 1 Glasses of wine per week   Drug use: Never     Allergies   Oxycodone-acetaminophen and Percocet [oxycodone-acetaminophen]   Review of Systems Review of Systems  Constitutional:  Positive for fatigue. Negative for chills and fever.  HENT:  Positive for congestion (chest), hearing loss, sore throat and voice change. Negative for ear discharge, ear pain, facial swelling, nosebleeds, postnasal drip, rhinorrhea, sinus pressure, sinus pain, sneezing and trouble swallowing.   Eyes:  Negative for pain, discharge and itching.  Respiratory:  Positive for cough and chest tightness. Negative for shortness of breath and wheezing.   Cardiovascular:  Negative for chest pain.  Gastrointestinal:  Negative for nausea and vomiting.  Musculoskeletal:  Positive for arthralgias. Negative for neck pain and neck stiffness.  Skin:  Negative for color change and rash.  Allergic/Immunologic:  Negative for environmental allergies, food allergies and immunocompromised state.  Neurological:  Positive for dizziness (after taking OTC cold medication) and light-headedness. Negative for tremors, seizures, speech difficulty and weakness.  Hematological:  Negative for adenopathy. Does not bruise/bleed easily.  Psychiatric/Behavioral:  Positive for sleep disturbance.     Physical Exam Triage Vital Signs ED Triage Vitals  Enc Vitals Group     BP 01/05/21 1428 (!) 154/76     Pulse Rate 01/05/21 1428 65     Resp 01/05/21 1428 18     Temp 01/05/21 1428 99.3 F (37.4 C)     Temp Source 01/05/21 1428 Oral     SpO2 01/05/21 1428 96 %     Weight --      Height --      Head Circumference --      Peak Flow --      Pain Score 01/05/21 1426 7     Pain Loc --      Pain Edu? --      Excl. in GC? --    No data found.  Updated Vital Signs BP (!) 154/76   Pulse 65   Temp 99.3 F (37.4 C) (Oral)   Resp 18   SpO2 96%   Visual Acuity Right Eye Distance:   Left Eye Distance:   Bilateral Distance:    Right Eye Near:   Left Eye Near:    Bilateral Near:     Physical Exam Vitals and nursing note reviewed.  Constitutional:      General: She is awake. She is not in acute distress.    Appearance: She is well-developed and well-groomed.     Comments: She is sitting in the exam chair in no acute distress but feels cold and is wrapped in a sheet/blanket.   HENT:     Head: Normocephalic and atraumatic.     Right Ear: Tympanic membrane, ear canal and external ear normal. Decreased hearing noted.     Left Ear: Tympanic membrane, ear canal and external ear normal. Decreased hearing noted.     Nose: Nose normal. No congestion or rhinorrhea.     Right Sinus: No maxillary sinus tenderness or frontal sinus tenderness.     Left Sinus: No maxillary sinus tenderness or frontal sinus tenderness.     Mouth/Throat:     Lips: Pink.     Mouth: Mucous membranes are moist.     Pharynx: Uvula midline.  Oropharyngeal exudate and posterior oropharyngeal erythema present. No pharyngeal swelling or uvula swelling.     Comments: Throat irritated with slight white post nasal drainage present.  Eyes:     Extraocular Movements: Extraocular movements intact.     Conjunctiva/sclera: Conjunctivae normal.  Cardiovascular:     Rate and Rhythm: Normal rate and regular rhythm.     Heart sounds: Normal heart sounds.  Pulmonary:     Effort: Pulmonary effort is normal. No  accessory muscle usage or respiratory distress.     Breath sounds: Normal air entry. No decreased air movement. Examination of the right-upper field reveals wheezing. Examination of the left-upper field reveals decreased breath sounds, wheezing and rhonchi. Decreased breath sounds, wheezing and rhonchi present. No rales.     Comments: Wheezes heard in upper lobes. Slight decreased breath sounds and coarse breath sounds when coughing on left upper lobe.  Musculoskeletal:     Cervical back: Normal range of motion and neck supple.  Lymphadenopathy:     Cervical: No cervical adenopathy.  Skin:    General: Skin is warm and dry.     Capillary Refill: Capillary refill takes less than 2 seconds.     Findings: No rash.  Neurological:     General: No focal deficit present.     Mental Status: She is alert and oriented to person, place, and time.  Psychiatric:        Attention and Perception: Attention normal.        Mood and Affect: Mood normal.        Speech: Speech normal.        Behavior: Behavior is cooperative.        Thought Content: Thought content normal.     UC Treatments / Results  Labs (all labs ordered are listed, but only abnormal results are displayed) Labs Reviewed  SARS CORONAVIRUS 2 (TAT 6-24 HRS)    EKG   Radiology DG Chest 2 View  Result Date: 01/05/2021 CLINICAL DATA:  cough and chest congestion for 4 days- getting worse with wheezing and shortness of breath EXAM: CHEST - 2 VIEW COMPARISON:  Radiograph  04/29/2017, chest CT 09/21/2013 FINDINGS: Unchanged cardiomediastinal silhouette. There is no new focal airspace consolidation. Blunting of the costophrenic angles bilaterally, unchanged from prior exam and likely due to pleural thickening. No large pleural effusion or visible pneumothorax. Multiple chronic bilateral rib injuries noted. Bilateral shoulder degenerative changes. Thoracic spondylosis. Multiple metallic suture like material overlying the anterior abdomen. IMPRESSION: No evidence of acute cardiopulmonary disease. Chronic bilateral costophrenic angle blunting bilaterally, favored to be related to pleural thickening and/or atelectasis. Electronically Signed   By: Caprice Renshaw   On: 01/05/2021 17:19    Procedures Procedures (including critical care time)  Medications Ordered in UC Medications - No data to display  Initial Impression / Assessment and Plan / UC Course  I have reviewed the triage vital signs and the nursing notes.  Pertinent labs & imaging results that were available during my care of the patient were reviewed by me and considered in my medical decision making (see chart for details).    Reviewed chest x-ray results with patient and daughter. No distinct pneumonia present. Discussed that she probably has a viral illness. Will test for COVID 19. May try Tessalon cough pills 1 every 8 hours as needed. May take Tylenol 1000mg  every 8 hours as needed for throat pain- most likely due to PND and irritation from cough. May also try OTC Delsym 2 teaspoons every 12 hours as needed for cough. Discussed that antihistamine in Coricidin probable cause for dizziness and near syncope- avoid any antihistamines or other OTC cold medication. Continue to push fluids to help loosen up mucus in chest. Follow-up pending COVID 19 test results and with her PCP in 3 to 4 days if not improving.    Final Clinical Impressions(s) / UC Diagnoses   Final diagnoses:  Cough  Wheezing  Acute sore throat  Discharge Instructions      Recommend start Tessalon cough pills 1 every 8 hours as needed. May take Tylenol 1000mg  every 8 hours as needed for throat pain. May also try OTC Delsym 2 teaspoons every 12 hours as needed for cough. Continue to push fluids to help loosen up any mucus in chest. Follow-up pending COVID 19 test results and with her PCP in 3 to 4 days if not improving.      ED Prescriptions     Medication Sig Dispense Auth. Provider   benzonatate (TESSALON) 100 MG capsule Take 1 capsule (100 mg total) by mouth 3 (three) times daily as needed for cough. 21 capsule Maleyah Evans, Ali LoweAnn Berry, NP      PDMP not reviewed this encounter.   Sudie GrumblingAmyot, Shanti Eichel Berry, NP 01/06/21 [phone]

## 2021-01-06 LAB — SARS CORONAVIRUS 2 (TAT 6-24 HRS): SARS Coronavirus 2: NEGATIVE

## 2021-07-11 ENCOUNTER — Emergency Department: Payer: Medicare Other

## 2021-07-11 ENCOUNTER — Other Ambulatory Visit: Payer: Self-pay

## 2021-07-11 ENCOUNTER — Emergency Department
Admission: EM | Admit: 2021-07-11 | Discharge: 2021-07-11 | Disposition: A | Discharge status: Home / Self Care | Payer: Medicare Other | Attending: Emergency Medicine | Admitting: Emergency Medicine

## 2021-07-11 DIAGNOSIS — I10 Essential (primary) hypertension: Secondary | ICD-10-CM | POA: Diagnosis not present

## 2021-07-11 DIAGNOSIS — R0789 Other chest pain: Secondary | ICD-10-CM | POA: Insufficient documentation

## 2021-07-11 DIAGNOSIS — E039 Hypothyroidism, unspecified: Secondary | ICD-10-CM | POA: Insufficient documentation

## 2021-07-11 DIAGNOSIS — R079 Chest pain, unspecified: Secondary | ICD-10-CM

## 2021-07-11 DIAGNOSIS — R001 Bradycardia, unspecified: Secondary | ICD-10-CM | POA: Diagnosis not present

## 2021-07-11 DIAGNOSIS — R0602 Shortness of breath: Secondary | ICD-10-CM | POA: Insufficient documentation

## 2021-07-11 LAB — BASIC METABOLIC PANEL
Anion gap: 3 — ABNORMAL LOW (ref 5–15)
BUN: 17 mg/dL (ref 8–23)
CO2: 32 mmol/L (ref 22–32)
Calcium: 9 mg/dL (ref 8.9–10.3)
Chloride: 102 mmol/L (ref 98–111)
Creatinine, Ser: 0.84 mg/dL (ref 0.44–1.00)
GFR, Estimated: >60 mL/min (ref >60)
Glucose, Bld: 102 mg/dL — ABNORMAL HIGH (ref 70–99)
Potassium: 3.9 mmol/L (ref 3.5–5.1)
Sodium: 137 mmol/L (ref 135–145)

## 2021-07-11 LAB — TROPONIN I (HIGH SENSITIVITY): Troponin I (High Sensitivity): 9 ng/L (ref <18)

## 2021-07-11 LAB — CBC
HCT: 42 % (ref 36.0–46.0)
Hemoglobin: 13.9 g/dL (ref 12.0–15.0)
MCH: 32.8 pg (ref 26.0–34.0)
MCHC: 33.1 g/dL (ref 30.0–36.0)
MCV: 99.1 fL (ref 80.0–100.0)
Platelets: 332 10*3/uL (ref 150–400)
RBC: 4.24 MIL/uL (ref 3.87–5.11)
RDW: 13.2 % (ref 11.5–15.5)
WBC: 6.7 10*3/uL (ref 4.0–10.5)
nRBC: 0 % (ref 0.0–0.2)

## 2021-07-11 NOTE — ED Provider Notes (Signed)
[facility] Provider Note    Event Date/Time   First MD Initiated Contact with Patient 07/11/21 [phone]     (approximate)   History   Hypertension   HPI  Nereyda Westermeyer is a 88 y.o. female  HTN, HLD who presents with chest pain.  Symptoms have been going on for the past 10 days.  Patient Dors is a dull pressure-like sensation in the right axilla and to the right breast.  Pain comes and goes, no clear exacerbating or alleviating factors.  Nonexertional nonpleuritic.  Has mild shortness of breath.  Patient does go to the gym and do exercises, thinks she could have pulled a muscle.  She denies any nausea or diaphoresis associated.  No lower extremity swelling.  No history of similar.  His daughter noted that today her blood pressure was elevated around 99991111 systolic so that is why they specifically brought her in today.  Patient took a clonidine at home.  She is advised by her PCP that if her blood pressure is over 190 that she take a clonidine, usually does not need to.  Patient has a history of sinus bradycardia in the past, follows with Dr. Nehemiah Massed for this.     Past Medical History:  Diagnosis Date   Hyperlipidemia    Hypertension    Thyroid disease     Patient Active Problem List   Diagnosis Date Noted   Clinical depression 02/04/2015   H/O fall 02/04/2015   DDD (degenerative disc disease), lumbosacral 02/04/2015   Disease of thyroid gland 02/04/2015   Difficulty hearing 04/01/2013   Allergic rhinitis, seasonal 04/01/2013   DD (diverticular disease) 11/21/2012   Ceruminosis 11/21/2012   Generalized OA 11/21/2012   Adult hypothyroidism 11/21/2012   Menopausal and perimenopausal disorder 11/21/2012   BP (high blood pressure) 04/18/2012     Physical Exam  Triage Vital Signs: ED Triage Vitals  Enc Vitals Group     BP 07/11/21 0721 108/66     Pulse Rate 07/11/21 0721 (!) 53     Resp 07/11/21 0721 20     Temp 07/11/21 0721 97.7 F (36.5 C)      Temp src --      SpO2 07/11/21 0721 100 %     Weight 07/11/21 0720 135 lb (61.2 kg)     Height 07/11/21 0720 5' (1.524 m)     Head Circumference --      Peak Flow --      Pain Score 07/11/21 0719 4     Pain Loc --      Pain Edu? --      Excl. in [place]? --     Most recent vital signs: Vitals:   07/11/21 0830 07/11/21 0900  BP: 113/60 122/64  Pulse: (!) 50 (!) 50  Resp: 15 18  Temp:    SpO2: 97% 97%     General: Awake, no distress. CV:  Good peripheral perfusion.  No lower extremity edema Resp:  Normal effort.  Abd:  No distention.  Neuro:             Awake, Alert, Oriented x 3  Other:  Mild tenderness to palpation in the right lateral chest wall, no overlying rash   ED Results / Procedures / Treatments  Labs (all labs ordered are listed, but only abnormal results are displayed) Labs Reviewed  BASIC METABOLIC PANEL - Abnormal; Notable for the following components:      Result Value   Glucose, Bld  102 (*)    Anion gap 3 (*)    All other components within normal limits  CBC  TROPONIN I (HIGH SENSITIVITY)  TROPONIN I (HIGH SENSITIVITY)     EKG  EKG interpreted by myself, sinus bradycardia, normal axis normal intervals no acute ischemic changes  RADIOLOGY I reviewed the CXR which does not show any acute cardiopulmonary process; agree with radiology report     PROCEDURES:  Critical Care performed: No  .1-3 Lead EKG Interpretation Performed by: Rada Hay, MD Authorized by: Rada Hay, MD     Interpretation: abnormal     ECG rate assessment: bradycardic     Rhythm: sinus rhythm     Ectopy: none     Conduction: normal    The patient is on the cardiac monitor to evaluate for evidence of arrhythmia and/or significant heart rate changes.   MEDICATIONS ORDERED IN ED: Medications - No data to display   IMPRESSION / MDM / [place] / ED COURSE  I reviewed the triage vital signs and the nursing notes.                               Differential diagnosis includes, but is not limited to, angina, ACS, pulmonary embolism, pneumonia, pleurisy, musculoskeletal  Patient is a 88 year old female with a history of sinus bradycardia hypertension without any prior history of MI who presents with about 10 days of intermittent chest pain.  Pain is located in the right side of her chest and axilla, has been coming and going without any clear exacerbating alleviating factors.  It is atypical and that is not exertional she has no associated nausea diaphoresis, mild dyspnea.  Patient was specifically brought in today because her blood pressure was elevated at home, in the 190s.  Did have a clonidine prior to coming in and now blood pressure in the ED is well controlled.  She is noted to be in sinus rhythm with bradycardic rate which is her baseline.  Patient otherwise appears well clear lungs no lower extremity edema.  Reviewed her EKG which has no ischemic changes.  Will check troponin.  My suspicion for pulmonary embolism is low given the intermittent nature of the pain and that she is not tachycardic or hypoxic.   Opponent is negative, I reviewed the CBC and BMP which are also reassuring.  Chest x-ray has no infiltrate.  Overall given the nature of the pain and that it is worse with movement and reproducible on exam I suspect this is most likely musculoskeletal.  I think she is appropriate for discharge.      FINAL CLINICAL IMPRESSION(S) / ED DIAGNOSES   Final diagnoses:  Chest pain, unspecified type     Rx / DC Orders   ED Discharge Orders     None        Note:  This document was prepared using Dragon voice recognition software and may include unintentional dictation errors.   Rada Hay, MD 07/11/21 [phone]

## 2021-07-11 NOTE — ED Triage Notes (Signed)
Pt to ED for HTN and pain under right breast. +shob.  Denies n/v.

## 2021-07-11 NOTE — Discharge Instructions (Signed)
Your EKG, chest x-ray and cardiac enzymes were all reassuring today.  If you have any new or worsening chest pain or other symptoms that are concerning to you, please return to the emergency department.  Otherwise please follow-up with your primary care provider.

## 2021-10-27 IMAGING — NM NM BONE 3 PHASE
1 series · 6 of 6 positions shown · non-contrast
Comparison: None

Correlation: Pelvic radiograph 05/24/2017

CLINICAL DATA: RIGHT hip pain for couple months after beginning
yoga, history of RIGHT hip arthroplasty in 4883 question mechanical
loosening of RIGHT hip prosthesis

EXAM:
NUCLEAR MEDICINE 3-PHASE BONE SCAN
TECHNIQUE: Radionuclide angiographic images, immediate static blood pool
images, and 3-hour delayed static images were obtained of the hips
after intravenous injection of radiopharmaceutical. concomitant CT
imaging was performed. Fused sets of CT and SPECT data are reviewed
in 3 planes.
RADIOPHARMACEUTICALS:  22.65 mCi Bc-BBm MDP IV

[Series 1000: bone (recon - ac ) · 4.8mm · 4.80mm/px · 6 of 80 frames shown]
[frame 7/80]
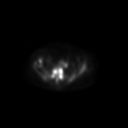
[frame 20/80]
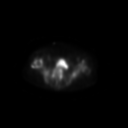
[frame 34/80]
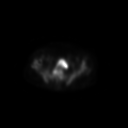
[frame 47/80]
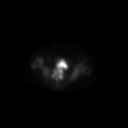
[frame 60/80]
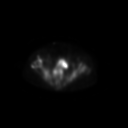
[frame 74/80]
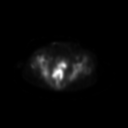

[6 of 6 positions shown; findings below may reference images not displayed]

FINDINGS: Vascular phase: Mildly increased blood flow to the region of the
RIGHT greater trochanter. No other sites of abnormal blood flow
identified on vascular phase.

Blood pool phase: Minimally increased blood pool at the RIGHT
greater and lesser trochanteric regions. No abnormal blood pool at
LEFT hip.

Delayed phase: Uptake at LEFT hip joint consistent with degenerative
changes. Photopenic defect at RIGHT hip from prosthetic hardware.
Focal delayed uptake at the RIGHT greater trochanter laterally, at
the area of increased blood flow and blood pool. These do not appear
to be immediately adjacent to the prosthesis but at the lateral
margin of the greater trochanter. Findings raise a question of
trauma or potentially related to muscle strain at insertion. No
typical uptake is adjacent to the proximal and distal aspects of the
femoral component to suggest mechanical loosening. Uptake in lumbar
spine at LEFT L4-L5, likely degenerative.
IMPRESSION: Increased blood flow, blood pool and delayed mild increased uptake
of tracer at the lateral margin of the RIGHT greater trochanter,
favoring trauma or strain injury of muscular insertion.

Typical pattern of uptake identified with mechanical loosening of
the femoral component of a hip prosthesis is not identified.

Degenerative type uptake at lower lumbar spine and LEFT hip joint.

## 2022-12-16 ENCOUNTER — Ambulatory Visit
Admission: RE | Admit: 2022-12-16 | Discharge: 2022-12-16 | Disposition: A | Discharge status: Home / Self Care | Payer: Medicare Other | Source: Ambulatory Visit | Attending: Emergency Medicine | Admitting: Emergency Medicine

## 2022-12-16 VITALS — BP 160/80 | HR 53 | Temp 98.1°F | Resp 14 | Ht 60.0 in | Wt 134.9 lb

## 2022-12-16 DIAGNOSIS — W548XXA Other contact with dog, initial encounter: Secondary | ICD-10-CM

## 2022-12-16 DIAGNOSIS — L03115 Cellulitis of right lower limb: Secondary | ICD-10-CM | POA: Diagnosis not present

## 2022-12-16 DIAGNOSIS — Z23 Encounter for immunization: Secondary | ICD-10-CM

## 2022-12-16 MED ORDER — AMOXICILLIN-POT CLAVULANATE 875-125 MG PO TABS
1.0000 | ORAL_TABLET | Freq: Two times a day (BID) | ORAL | 0 refills | Status: AC
Start: 2022-12-16 — End: 2022-12-23

## 2022-12-16 MED ORDER — TETANUS-DIPHTH-ACELL PERTUSSIS 5-2.5-18.5 LF-MCG/0.5 IM SUSY
0.5000 mL | PREFILLED_SYRINGE | Freq: Once | INTRAMUSCULAR | Status: AC
  Administered 2022-12-16: 0.5 mL via INTRAMUSCULAR

## 2022-12-16 NOTE — ED Provider Notes (Signed)
[facility]    CSN: [id number] Arrival date & time: 12/16/22  4098      History   Chief Complaint Chief Complaint  Patient presents with   Wound Check   Appointment    HPI [name] is a 89 y.o. female.   HPI  89 year old female with past medical history significant for hard of hearing, hypothyroidism, hypertension, and hyperlipidemia presents for evaluation of 2 wounds on her right lower leg that she sustained 4 days ago.  She was scratched by a dog.  She came in today because the wound on the outside of her right lower leg has become red and warm.  Patient denies any fever or drainage.  Unsure of last tetanus shot.  Past Medical History:  Diagnosis Date   Hyperlipidemia    Hypertension    Thyroid disease     Patient Active Problem List   Diagnosis Date Noted   Clinical depression 02/04/2015   H/O fall 02/04/2015   DDD (degenerative disc disease), lumbosacral 02/04/2015   Disease of thyroid gland 02/04/2015   Difficulty hearing 04/01/2013   Allergic rhinitis, seasonal 04/01/2013   DD (diverticular disease) 11/21/2012   Ceruminosis 11/21/2012   Generalized OA 11/21/2012   Adult hypothyroidism 11/21/2012   Menopausal and perimenopausal disorder 11/21/2012   BP (high blood pressure) 04/18/2012    Past Surgical History:  Procedure Laterality Date   BREAST SURGERY     benign tumor   CHOLECYSTECTOMY     DILATION AND CURETTAGE OF UTERUS     had several    OB History   No obstetric history on file.      Home Medications    Prior to Admission medications   Medication Sig Start Date End Date Taking? Authorizing Provider  amoxicillin-clavulanate (AUGMENTIN) 875-125 MG tablet Take 1 tablet by mouth every 12 (twelve) hours for 7 days. 12/16/22 12/23/22 Yes Becky Augusta, NP  cloNIDine (CATAPRES) 0.1 MG tablet Take 0.1 mg by mouth daily as needed (take if systolic >190 or diastolic >100).   Yes [provider]  hydrochlorothiazide  (MICROZIDE) 12.5 MG capsule Take 12.5 mg by mouth daily.   Yes [provider]  levothyroxine (SYNTHROID, LEVOTHROID) 50 MCG tablet Take 50 mcg by mouth daily before breakfast.   Yes [provider]    Family History Family History  Problem Relation Age of Onset   Hypertension Mother    Stroke Mother    Colon cancer Father    Stroke Brother    Hypertension Brother     Social History Social History   Tobacco Use   Smoking status: Never   Smokeless tobacco: Never  Vaping Use   Vaping status: Never Used  Substance Use Topics   Alcohol use: Yes    Alcohol/week: 1.0 standard drink of alcohol    Types: 1 Glasses of wine per week   Drug use: Never     Allergies   Oxycodone-acetaminophen and Percocet [oxycodone-acetaminophen]   Review of Systems Review of Systems  Constitutional:  Negative for fever.  Skin:  Positive for color change and wound.     Physical Exam Triage Vital Signs ED Triage Vitals  Encounter Vitals Group     BP      Systolic BP Percentile      Diastolic BP Percentile      Pulse      Resp      Temp      Temp src      SpO2  Weight      Height      Head Circumference      Peak Flow      Pain Score      Pain Loc      Pain Education      Exclude from Growth Chart    No data found.  Updated Vital Signs BP (!) 160/80 (BP Location: Left Arm)   Pulse (!) 53   Temp 98.1 F (36.7 C) (Oral)   Resp 14   Ht 5' (1.524 m)   Wt 134 lb 14.7 oz (61.2 kg)   SpO2 94%   BMI 26.35 kg/m   Visual Acuity Right Eye Distance:   Left Eye Distance:   Bilateral Distance:    Right Eye Near:   Left Eye Near:    Bilateral Near:     Physical Exam Vitals and nursing note reviewed.  Constitutional:      Appearance: Normal appearance. She is not ill-appearing.  HENT:     Head: Normocephalic and atraumatic.  Skin:    General: Skin is warm and dry.     Capillary Refill: Capillary refill takes less than 2 seconds.     Findings:  Erythema present.  Neurological:     General: No focal deficit present.     Mental Status: She is alert and oriented to person, place, and time.      UC Treatments / Results  Labs (all labs ordered are listed, but only abnormal results are displayed) Labs Reviewed - No data to display  EKG   Radiology No results found.  Procedures Procedures (including critical care time)  Medications Ordered in UC Medications  Tdap (BOOSTRIX) injection 0.5 mL (has no administration in time range)    Initial Impression / Assessment and Plan / UC Course  I have reviewed the triage vital signs and the nursing notes.  Pertinent labs & imaging results that were available during my care of the patient were reviewed by me and considered in my medical decision making (see chart for details).   Patient is a very pleasant, nontoxic-appearing 40 female presenting for evaluation of 2 wounds to her right lower leg that she sustained 4 days ago as result of a dog scratch.  The wound above is on the superior, anterior lateral aspect of her right lower leg and as you can see there is a scabbed wound with surrounding erythema and mild edema.  The wound is tender to touch as well as warm to touch.  No pus drainage.  The wound and the above images on the inferior medial aspect of the right lower leg and has mild surrounding erythema but it is not warm to touch and there is no appreciable edema.  The patient's last tetanus shot was in 2017 so we will update that here in clinic.  I will discharge patient home on Augmentin 875 twice daily for 7 days to treat for cellulitis as result of the dog scratch.  She should continue to monitor the wounds and return if she has any increased redness, swelling, red streaks going up her leg, pus drainage, or fevers.  Final Clinical Impressions(s) / UC Diagnoses   Final diagnoses:  Cellulitis of right lower extremity  Dog scratch     Discharge Instructions      Take the  Augmentin 875 mg with food every 12 hours for the next 7 days to treat the infection from your dog scratch.  Keep the wounds clean and dry.  Monitor  the wounds for any increased redness, swelling, red streaks going up your leg, pus drainage, or if you develop a fever.  If any of these conditions develop you need to either return for reevaluation or seek care in the emergency department.     ED Prescriptions     Medication Sig Dispense Auth. Provider   amoxicillin-clavulanate (AUGMENTIN) 875-125 MG tablet Take 1 tablet by mouth every 12 (twelve) hours for 7 days. 14 tablet Becky Augusta, NP      PDMP not reviewed this encounter.   Becky Augusta, NP 12/16/22 [phone]

## 2022-12-16 NOTE — ED Triage Notes (Signed)
Pt has a week old cut on her right lower leg. Occurred about a week ago. She was scratched by a dog.

## 2022-12-16 NOTE — Discharge Instructions (Addendum)
Take the Augmentin 875 mg with food every 12 hours for the next 7 days to treat the infection from your dog scratch.  Keep the wounds clean and dry.  Monitor the wounds for any increased redness, swelling, red streaks going up your leg, pus drainage, or if you develop a fever.  If any of these conditions develop you need to either return for reevaluation or seek care in the emergency department.

## 2023-04-12 ENCOUNTER — Ambulatory Visit: Payer: Medicare Other

## 2023-04-12 ENCOUNTER — Ambulatory Visit
Admission: RE | Admit: 2023-04-12 | Discharge: 2023-04-12 | Disposition: A | Discharge status: Home / Self Care | Payer: Medicare Other | Source: Ambulatory Visit | Attending: Physician Assistant

## 2023-04-12 VITALS — BP 136/106 | HR 55 | Temp 97.7°F | Resp 17

## 2023-04-12 DIAGNOSIS — J069 Acute upper respiratory infection, unspecified: Secondary | ICD-10-CM

## 2023-04-12 LAB — SARS CORONAVIRUS 2 BY RT PCR: SARS Coronavirus 2 by RT PCR: NEGATIVE

## 2023-04-12 MED ORDER — BENZONATATE 200 MG PO CAPS: 200.0000 mg | ORAL_CAPSULE | Freq: Three times a day (TID) | ORAL | 0 refills | Status: DC | PRN

## 2023-04-12 NOTE — Discharge Instructions (Addendum)
-  COVID test is negative.  We will call if the chest x-ray is abnormal tomorrow.   -Increase rest and fluids. - I sent cough medicine to the pharmacy.  You may also continue the cough medication you have at home. - You need to be seen again if you have fever, worsening cough, chest pain, wheezing/shortness of breath or increased weakness.

## 2023-04-12 NOTE — ED Provider Notes (Signed)
 [facility]    CSN: [id number] Arrival date & time: 04/12/23  1809      History   Chief Complaint Chief Complaint  Patient presents with   Cough    Entered by patient    HPI [name] is a 89 y.o. female presenting with her daughter for 4-day history of cough and fatigue.  Denies fever, nasal congestion, sore throat, ear pain, chest pain, wheezing, shortness of breath, abdominal pain, vomiting or diarrhea.  Has been taking antihistamines, cough syrup and vitamin C.  Patient's medical history is significant for hypertension, hyperlipidemia, dementia, stage III CKD, thyroid disease.  She also has history of splenectomy.  HPI  Past Medical History:  Diagnosis Date   Hyperlipidemia    Hypertension    Thyroid disease     Patient Active Problem List   Diagnosis Date Noted   Clinical depression 02/04/2015   H/O fall 02/04/2015   DDD (degenerative disc disease), lumbosacral 02/04/2015   Disease of thyroid gland 02/04/2015   Difficulty hearing 04/01/2013   Allergic rhinitis, seasonal 04/01/2013   DD (diverticular disease) 11/21/2012   Ceruminosis 11/21/2012   Generalized OA 11/21/2012   Adult hypothyroidism 11/21/2012   Menopausal and perimenopausal disorder 11/21/2012   BP (high blood pressure) 04/18/2012    Past Surgical History:  Procedure Laterality Date   BREAST SURGERY     benign tumor   CHOLECYSTECTOMY     DILATION AND CURETTAGE OF UTERUS     had several    OB History   No obstetric history on file.      Home Medications    Prior to Admission medications   Medication Sig Start Date End Date Taking? Authorizing Provider  benzonatate (TESSALON) 200 MG capsule Take 1 capsule (200 mg total) by mouth 3 (three) times daily as needed for cough. 04/12/23  Yes Shirlee Latch, PA-C  hydrochlorothiazide (MICROZIDE) 12.5 MG capsule Take 12.5 mg by mouth daily.   Yes [provider]  levothyroxine (SYNTHROID, LEVOTHROID) 50 MCG tablet  Take 50 mcg by mouth daily before breakfast.   Yes [provider]  cloNIDine (CATAPRES) 0.1 MG tablet Take 0.1 mg by mouth daily as needed (take if systolic >190 or diastolic >100).    [provider]    Family History Family History  Problem Relation Age of Onset   Hypertension Mother    Stroke Mother    Colon cancer Father    Stroke Brother    Hypertension Brother     Social History Social History   Tobacco Use   Smoking status: Never   Smokeless tobacco: Never  Vaping Use   Vaping status: Never Used  Substance Use Topics   Alcohol use: Yes    Alcohol/week: 1.0 standard drink of alcohol    Types: 1 Glasses of wine per week   Drug use: Never     Allergies   Oxycodone-acetaminophen and Percocet [oxycodone-acetaminophen]   Review of Systems Review of Systems  Constitutional:  Positive for fatigue. Negative for chills, diaphoresis and fever.  HENT:  Positive for congestion. Negative for ear pain, rhinorrhea, sinus pressure, sinus pain and sore throat.   Respiratory:  Positive for cough. Negative for shortness of breath.   Cardiovascular:  Negative for chest pain.  Gastrointestinal:  Negative for abdominal pain, nausea and vomiting.  Musculoskeletal:  Negative for arthralgias and myalgias.  Skin:  Negative for rash.  Neurological:  Negative for weakness and headaches.  Hematological:  Negative for adenopathy.  Physical Exam Triage Vital Signs ED Triage Vitals  Encounter Vitals Group     BP      Systolic BP Percentile      Diastolic BP Percentile      Pulse      Resp      Temp      Temp src      SpO2      Weight      Height      Head Circumference      Peak Flow      Pain Score      Pain Loc      Pain Education      Exclude from Growth Chart    No data found.  Updated Vital Signs BP (!) 136/106 (BP Location: Right Arm)   Pulse (!) 55   Temp 97.7 F (36.5 C) (Oral)   Resp 17   SpO2 98%   Physical Exam Vitals and nursing  note reviewed.  Constitutional:      General: She is not in acute distress.    Appearance: Normal appearance. She is not ill-appearing or toxic-appearing.  HENT:     Head: Normocephalic and atraumatic.     Nose: Congestion present.     Mouth/Throat:     Mouth: Mucous membranes are moist.     Pharynx: Oropharynx is clear.  Eyes:     General: No scleral icterus.       Right eye: No discharge.        Left eye: No discharge.     Conjunctiva/sclera: Conjunctivae normal.  Cardiovascular:     Rate and Rhythm: Regular rhythm. Bradycardia present.     Heart sounds: Normal heart sounds.  Pulmonary:     Effort: Pulmonary effort is normal. No respiratory distress.     Breath sounds: Normal breath sounds.  Musculoskeletal:     Cervical back: Neck supple.  Skin:    General: Skin is dry.  Neurological:     General: No focal deficit present.     Mental Status: She is alert. Mental status is at baseline.     Motor: No weakness.     Gait: Gait normal.  Psychiatric:        Mood and Affect: Mood normal.        Behavior: Behavior normal.        Thought Content: Thought content normal.      UC Treatments / Results  Labs (all labs ordered are listed, but only abnormal results are displayed) Labs Reviewed  SARS CORONAVIRUS 2 BY RT PCR    EKG   Radiology No results found.  Procedures Procedures (including critical care time)  Medications Ordered in UC Medications - No data to display  Initial Impression / Assessment and Plan / UC Course  I have reviewed the triage vital signs and the nursing notes.  Pertinent labs & imaging results that were available during my care of the patient were reviewed by me and considered in my medical decision making (see chart for details).   89 y/o female with history of HTN, HLD, dementia, thyroid disease and splenectomy presents with daughter for cough and fatigue x 4 days. Denies fever, sore throat, chest pain, SOB.  Patient is afebrile and  overall well appearing.  Has mild nasal congestion.  Throat is clear.  Chest clear to auscultation.  Cough sounds very deep and is frequent.  COVID test and CXR obtained. Negative COVID.   Wet read on chest x-ray is  negative but advised that I will call if the radiologist sees pneumonia and send antibiotics.  Till then advises likely viral and suggested that they continue with the over-the-counter medications and supportive care.  Sent Lawyer to pharmacy.  Advised to return for fever, worsening cough, shortness of breath. *** Final Clinical Impressions(s) / UC Diagnoses   Final diagnoses:  Viral URI with cough     Discharge Instructions      -COVID test is negative.  We will call if the chest x-ray is abnormal tomorrow.   -Increase rest and fluids. - I sent cough medicine to the pharmacy.  You may also continue the cough medication you have at home. - You need to be seen again if you have fever, worsening cough, chest pain, wheezing/shortness of breath or increased weakness.     ED Prescriptions     Medication Sig Dispense Auth. Provider   benzonatate (TESSALON) 200 MG capsule Take 1 capsule (200 mg total) by mouth 3 (three) times daily as needed for cough. 30 capsule Shirlee Latch, PA-C      PDMP not reviewed this encounter.

## 2023-04-12 NOTE — ED Triage Notes (Addendum)
Sx started Monday. Cough and fatigue. Daughter is here with patient. She gave an allergy pill this morning and patient has been taking emerg-n c

## 2023-04-22 ENCOUNTER — Ambulatory Visit: Payer: Self-pay

## 2023-06-11 ENCOUNTER — Ambulatory Visit: Payer: Self-pay

## 2023-06-19 ENCOUNTER — Ambulatory Visit
Admission: EM | Admit: 2023-06-19 | Discharge: 2023-06-19 | Disposition: A | Discharge status: Home / Self Care | Payer: Medicare Other | Attending: Emergency Medicine | Admitting: Emergency Medicine

## 2023-06-19 ENCOUNTER — Ambulatory Visit: Payer: Medicare Other

## 2023-06-19 DIAGNOSIS — T148XXA Other injury of unspecified body region, initial encounter: Secondary | ICD-10-CM | POA: Insufficient documentation

## 2023-06-19 DIAGNOSIS — I10 Essential (primary) hypertension: Secondary | ICD-10-CM | POA: Insufficient documentation

## 2023-06-19 DIAGNOSIS — W19XXXA Unspecified fall, initial encounter: Secondary | ICD-10-CM | POA: Diagnosis not present

## 2023-06-19 DIAGNOSIS — W010XXA Fall on same level from slipping, tripping and stumbling without subsequent striking against object, initial encounter: Secondary | ICD-10-CM | POA: Diagnosis not present

## 2023-06-19 DIAGNOSIS — S0083XA Contusion of other part of head, initial encounter: Secondary | ICD-10-CM | POA: Diagnosis present

## 2023-06-19 DIAGNOSIS — S0990XA Unspecified injury of head, initial encounter: Secondary | ICD-10-CM | POA: Diagnosis not present

## 2023-06-19 DIAGNOSIS — S0031XA Abrasion of nose, initial encounter: Secondary | ICD-10-CM | POA: Insufficient documentation

## 2023-06-19 DIAGNOSIS — S01511A Laceration without foreign body of lip, initial encounter: Secondary | ICD-10-CM

## 2023-06-19 DIAGNOSIS — Y9301 Activity, walking, marching and hiking: Secondary | ICD-10-CM | POA: Diagnosis not present

## 2023-06-19 MED ORDER — AMOXICILLIN-POT CLAVULANATE 875-125 MG PO TABS: 1.0000 | ORAL_TABLET | Freq: Two times a day (BID) | ORAL | 0 refills | Status: AC

## 2023-06-19 MED ORDER — ACETAMINOPHEN 325 MG PO TABS
975.0000 mg | ORAL_TABLET | Freq: Once | ORAL | Status: AC
  Administered 2023-06-19: 975 mg via ORAL

## 2023-06-19 NOTE — Discharge Instructions (Addendum)
 Your head CT did not show any worrisome findings.  Continue to use over-the-counter Tylenol  according to the package instructions as needed for pain.  You may apply ice to your nose and upper lip for 20 minutes at a time, 2-3 times a day to help with pain and swelling.  Keep the abrasion on your nose clean and dry.  You may apply Neosporin or bacitracin twice daily to the abrasion for the next 1 to 2 days until a scab has formed.  Once the scab is formed do not apply any more ointment.  For the laceration on the inside of your upper lip I want you to take Augmentin  875 mg twice daily with food for 7 days.  You should also rinse with warm salt water or Listerine following meals to ensure that food particles do not become trapped which could lead to infection.  If you develop any significant headache, changes in vision, forceful nausea or vomiting, or changes in behavior you need to call 911 and go to the emergency department.

## 2023-06-19 NOTE — ED Provider Notes (Addendum)
 [facility]    CSN: [id number] Arrival date & time: 06/19/23  1018      History   Chief Complaint Chief Complaint  Patient presents with   Fall    HPI [name] is a 89 y.o. female.   HPI   30 old female with past medical history significant for thyroid disease, hypertension, hyperlipidemia presents for evaluation of a closed head injury after suffering a ground-level fall at the gym this morning.  She reports that she was walking into a classroom when she tripped over a mat on the floor and landed on her face on the mat.  She did not experience of loss of consciousness.  She does have an abrasion to the bridge of her nose and she is complaining of pain in her upper lip.  No active bleeding at this time.  She denies any headache, nausea, or vomiting.  Last tetanus shot was in July 2024.  Patient had a similar fall backwards last week and had a CT scan at that time that was unremarkable per the family's report.  Past Medical History:  Diagnosis Date   Hyperlipidemia    Hypertension    Thyroid disease     Patient Active Problem List   Diagnosis Date Noted   Clinical depression 02/04/2015   H/O fall 02/04/2015   DDD (degenerative disc disease), lumbosacral 02/04/2015   Disease of thyroid gland 02/04/2015   Difficulty hearing 04/01/2013   Allergic rhinitis, seasonal 04/01/2013   DD (diverticular disease) 11/21/2012   Ceruminosis 11/21/2012   Generalized OA 11/21/2012   Adult hypothyroidism 11/21/2012   Menopausal and perimenopausal disorder 11/21/2012   BP (high blood pressure) 04/18/2012    Past Surgical History:  Procedure Laterality Date   BREAST SURGERY     benign tumor   CHOLECYSTECTOMY     DILATION AND CURETTAGE OF UTERUS     had several    OB History   No obstetric history on file.      Home Medications    Prior to Admission medications   Medication Sig Start Date End Date Taking? Authorizing Provider  amoxicillin -clavulanate  (AUGMENTIN ) 875-125 MG tablet Take 1 tablet by mouth every 12 (twelve) hours for 7 days. 06/19/23 06/26/23 Yes Kent Pear, NP  cloNIDine (CATAPRES) 0.1 MG tablet Take 0.1 mg by mouth daily as needed (take if systolic >190 or diastolic >100).    [provider]  hydrochlorothiazide (MICROZIDE) 12.5 MG capsule Take 12.5 mg by mouth daily.    [provider]  levothyroxine (SYNTHROID, LEVOTHROID) 50 MCG tablet Take 50 mcg by mouth daily before breakfast.    [provider]    Family History Family History  Problem Relation Age of Onset   Hypertension Mother    Stroke Mother    Colon cancer Father    Stroke Brother    Hypertension Brother     Social History Social History   Tobacco Use   Smoking status: Never   Smokeless tobacco: Never  Vaping Use   Vaping status: Never Used  Substance Use Topics   Alcohol use: Yes    Alcohol/week: 1.0 standard drink of alcohol    Types: 1 Glasses of wine per week   Drug use: Never     Allergies   Oxycodone-acetaminophen  and Percocet [oxycodone-acetaminophen ]   Review of Systems Review of Systems  Eyes:  Negative for visual disturbance.  Gastrointestinal:  Negative for nausea and vomiting.  Skin:  Positive for wound.  Neurological:  Negative for syncope, facial asymmetry and headaches.     Physical Exam Triage Vital Signs ED Triage Vitals  Encounter Vitals Group     BP      Systolic BP Percentile      Diastolic BP Percentile      Pulse      Resp      Temp      Temp src      SpO2      Weight      Height      Head Circumference      Peak Flow      Pain Score      Pain Loc      Pain Education      Exclude from Growth Chart    No data found.  Updated Vital Signs BP (!) 149/74 (BP Location: Right Arm)   Pulse 62   Temp 97.9 F (36.6 C) (Oral)   Resp 18   SpO2 94%   Visual Acuity Right Eye Distance:   Left Eye Distance:   Bilateral Distance:    Right Eye Near:   Left Eye Near:     Bilateral Near:     Physical Exam Vitals and nursing note reviewed.  Constitutional:      Appearance: Normal appearance. She is not ill-appearing.  HENT:     Head: Normocephalic.     Nose: No congestion or rhinorrhea.     Comments: To the tip of the nose. Skin:    General: Skin is warm and dry.     Capillary Refill: Capillary refill takes less than 2 seconds.     Findings: Bruising and erythema present.  Neurological:     General: No focal deficit present.     Mental Status: She is alert and oriented to person, place, and time.      UC Treatments / Results  Labs (all labs ordered are listed, but only abnormal results are displayed) Labs Reviewed - No data to display  EKG   Radiology CT Head Wo Contrast Result Date: 06/19/2023 CLINICAL DATA:  Provided history: Closed head injury, initial encounter. Head trauma, minor. Additional history provided: Fall (landing on face). EXAM: CT HEAD WITHOUT CONTRAST TECHNIQUE: Contiguous axial images were obtained from the base of the skull through the vertex without intravenous contrast. RADIATION DOSE REDUCTION: This exam was performed according to the departmental dose-optimization program which includes automated exposure control, adjustment of the mA and/or kV according to patient size and/or use of iterative reconstruction technique. COMPARISON:  Brain MRI 09/21/2013. FINDINGS: Brain: Mild generalized cerebral volume loss. Patchy and ill-defined hypoattenuation within the cerebral white matter, nonspecific but compatible with mild chronic small vessel ischemic disease. There is no acute intracranial hemorrhage. No demarcated cortical infarct. No extra-axial fluid collection. No evidence of an intracranial mass. No midline shift. Vascular: No hyperdense vessel.  Atherosclerotic calcifications. Skull: No calvarial fracture or aggressive osseous lesion. Sinuses/Orbits: No mass or acute finding within the imaged orbits. No significant paranasal  sinus disease at the imaged levels. Other: 10 mm partially calcified left frontal scalp lesion, likely benign and possibly reflecting a sebaceous cyst. IMPRESSION: 1. No evidence of an acute intracranial abnormality. 2. Mild parenchymal atrophy and chronic small vessel ischemic disease. 3. Please note, the facial structures are incompletely included in the field of view. If there is concern for an acute maxillofacial fracture, consider a maxillofacial CT for further evaluation. Electronically Signed   By: Bascom Lily D.O.   On: 06/19/2023 11:12  Procedures Procedures (including critical care time)  Medications Ordered in UC Medications  acetaminophen  (TYLENOL ) tablet 975 mg (975 mg Oral Given 06/19/23 1047)    Initial Impression / Assessment and Plan / UC Course  I have reviewed the triage vital signs and the nursing notes.  Pertinent labs & imaging results that were available during my care of the patient were reviewed by me and considered in my medical decision making (see chart for details).   Is a pleasant, nontoxic-appearing 59 old female presenting for evaluation of closed head injury after suffering a ground-level fall as outlined in HPI above.  As you can see in image above, there is an abrasion to the tip of the patient's nose as well as bruising to the upper lip.  The patient's pupils are equal round reactive and EOM is intact.  She has no tenderness with palpation of either zygomatic arch, bilateral orbits, and the bridge of her nose.  She does have mild tenderness at the tip of her nose, most likely from the abrasion and fall.  Her upper lip is also tender and some ecchymosis is forming.  There is no penetrating injury upon inspection of the inside of the upper lip.  No fractures or loose teeth.  No movement appreciated with manipulation of the alveolar ridge.  The patient is alert and oriented x 4 and she is able to recall the events of the accident.  She did suffer a similar  ground-level fall backwards last week and was evaluated at [facility].  She had a CT scan of her head and C-spine that did not show any abnormalities.  Given that she has yet again suffered a ground-level fall with a closed head injury I will obtain a CT scan of her head to rule out any bony abnormality.  She is not complaining of any cervical tenderness and she has full range of motion of her head.  There is also no tenderness with palpation of the bones of her face so I do not feel a facial CT is not warranted at this time.  Radiology impression of head CT states no evidence of acute intracranial abnormality.  Mild parenchymal atrophy and chronic small vessel ischemic disease.  I will discharge patient with a diagnosis of closed head injury and facial abrasions.  She should continue to use over-the-counter Tylenol  according to the package instructions as needed for any pain.  Keep your abrasions clean and dry.  She may apply ice to her face for 20 minutes at a time, 2-3 times a day as needed for pain and inflammation.  ER precautions reviewed.  At discharge the patient reports that she has been expectorating some blood.  Upon reinspection of the inner aspect of the upper lip there is a small split in the skin with some slight venous oozing.  This was not visualized earlier.  For this reason I will discharge patient home on Augmentin  875 twice daily with food for 7 days.  Final Clinical Impressions(s) / UC Diagnoses   Final diagnoses:  Closed head injury, initial encounter  Fall, initial encounter  Contusion of face, initial encounter  Abrasion  Lip laceration, initial encounter     Discharge Instructions      Your head CT did not show any worrisome findings.  Continue to use over-the-counter Tylenol  according to the package instructions as needed for pain.  You may apply ice to your nose and upper lip for 20 minutes at a time, 2-3 times a day  to help with pain and swelling.  Keep  the abrasion on your nose clean and dry.  You may apply Neosporin or bacitracin twice daily to the abrasion for the next 1 to 2 days until a scab has formed.  Once the scab is formed do not apply any more ointment.  For the laceration on the inside of your upper lip I want you to take Augmentin  875 mg twice daily with food for 7 days.  You should also rinse with warm salt water or Listerine following meals to ensure that food particles do not become trapped which could lead to infection.  If you develop any significant headache, changes in vision, forceful nausea or vomiting, or changes in behavior you need to call 911 and go to the emergency department.     ED Prescriptions     Medication Sig Dispense Auth. Provider   amoxicillin -clavulanate (AUGMENTIN ) 875-125 MG tablet Take 1 tablet by mouth every 12 (twelve) hours for 7 days. 14 tablet Kent Pear, NP      PDMP not reviewed this encounter.   Kent Pear, NP 06/19/23 1122    Kent Pear, NP 06/19/23 [phone]

## 2023-06-19 NOTE — ED Triage Notes (Signed)
 Patient states she tripped and fell and landed on her face at the gym. Patient denies LOC. Denies blood thinners. Bleeding controlled in triage.

## 2023-07-25 IMAGING — CR DG CHEST 2V
1 series · 2 of 2 positions shown · non-contrast
Comparison: 01/05/2021.

CLINICAL DATA: cp, shob

EXAM:
CHEST - 2 VIEW

[Series 1: dg chest 2 view · 0.14mm/px · 2 of 2 slices shown]
[im 1/2]
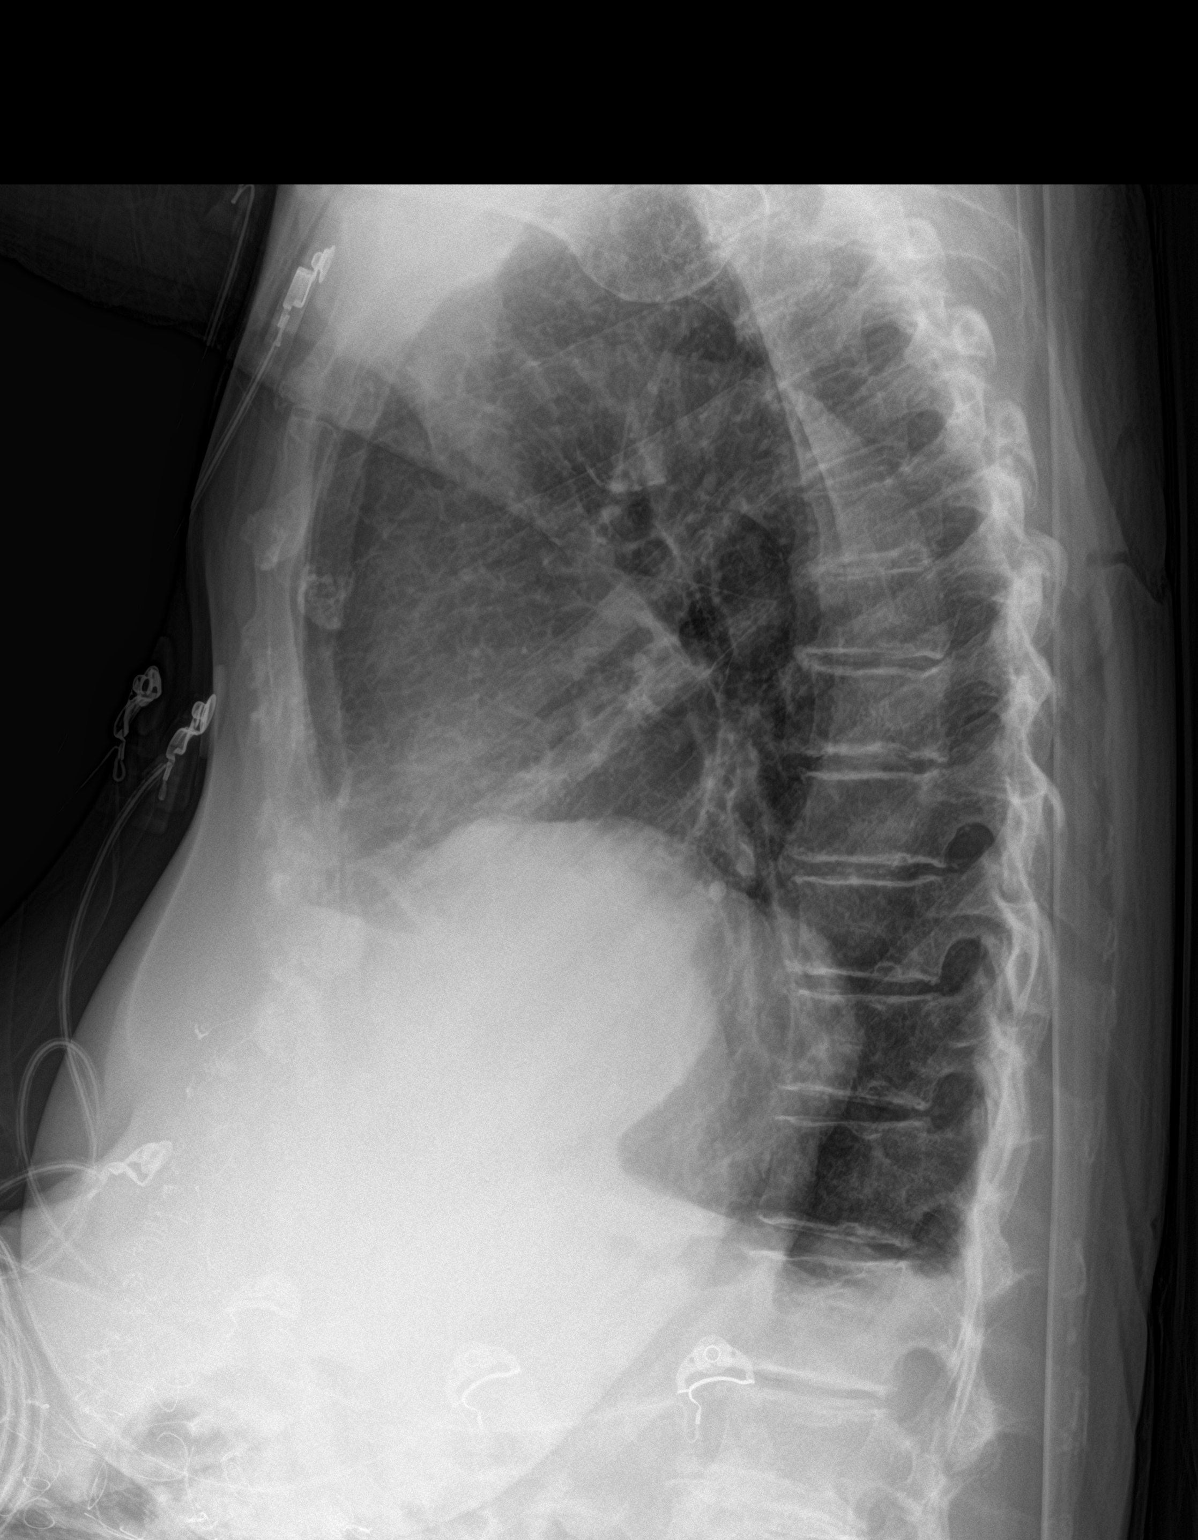
[im 2/2]
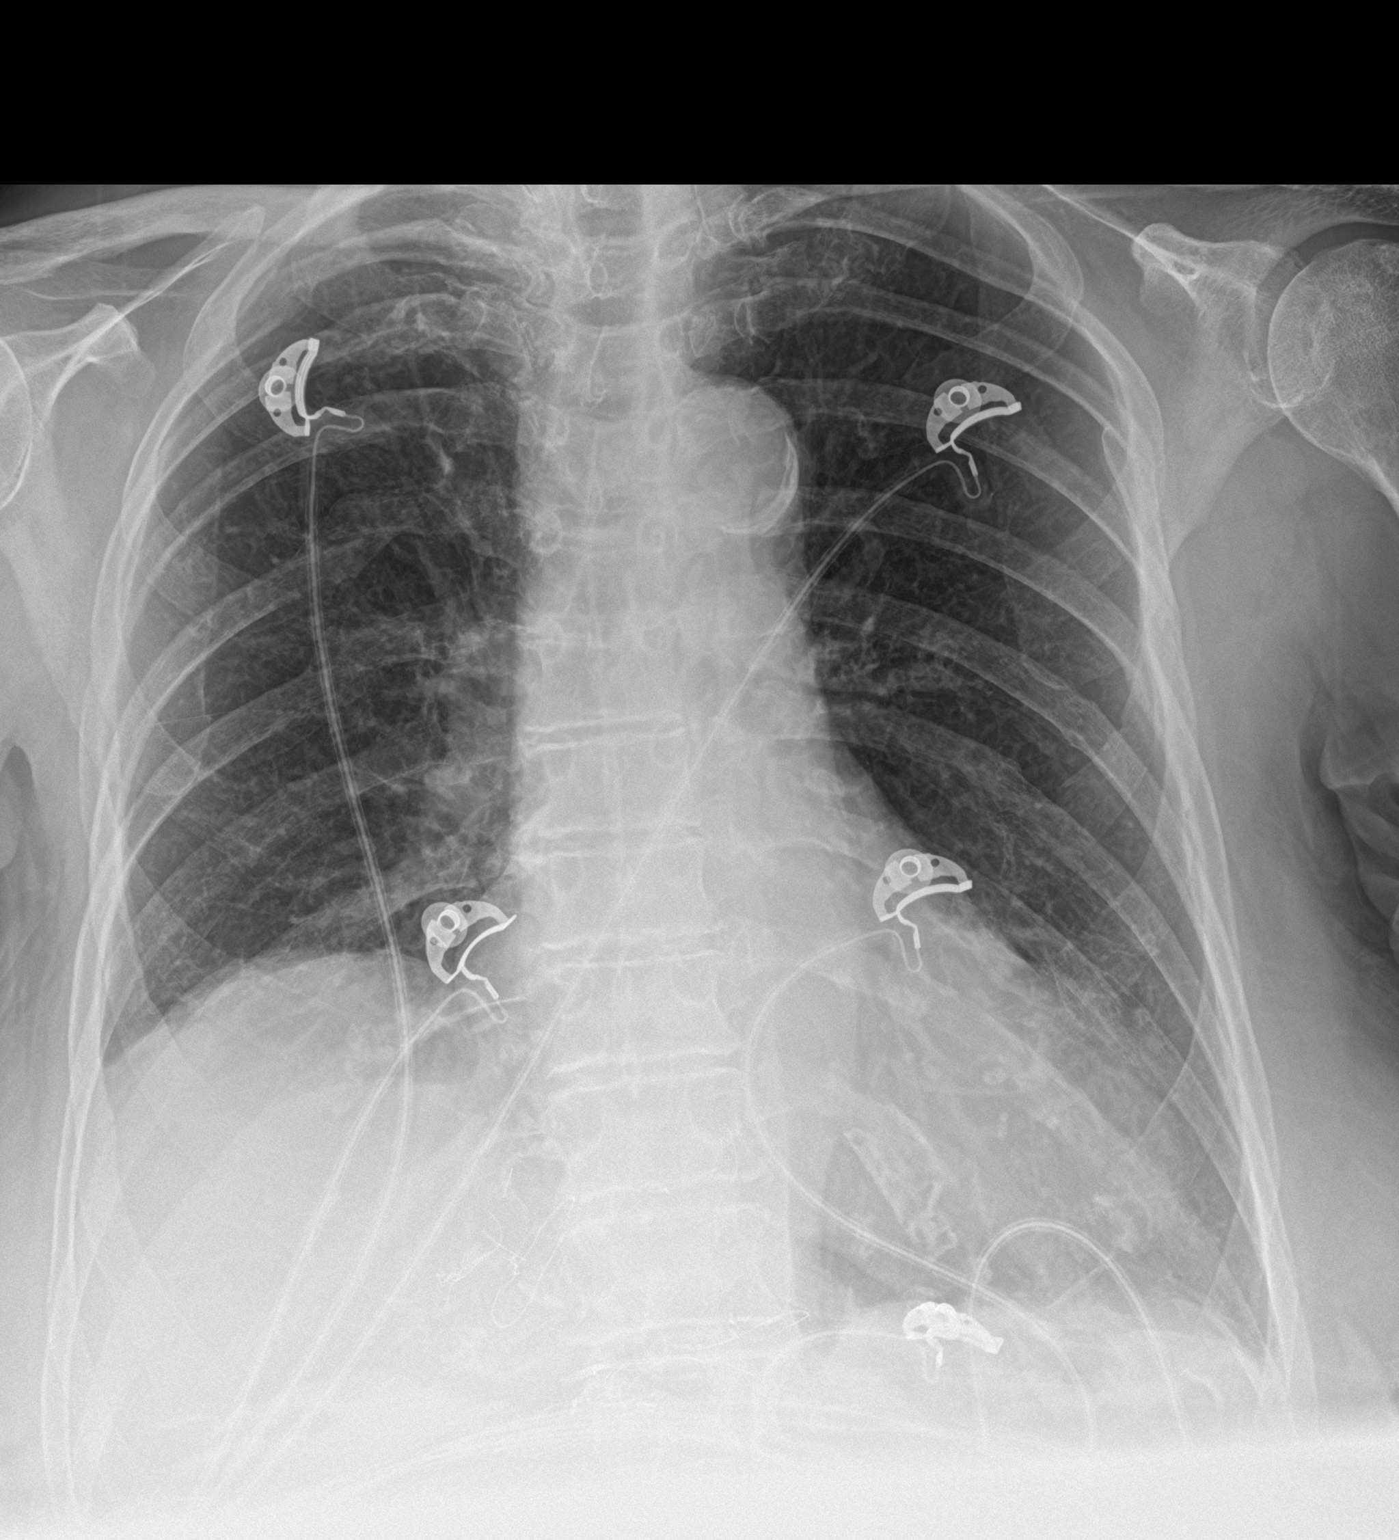

[2 of 2 positions shown; findings below may reference images not displayed]

FINDINGS: Chronically elevated right hemidiaphragm. No consolidation. No
visible pleural effusions or pneumothorax. No evidence of acute
osseous abnormality. Surgical sutures project over the right
hemidiaphragm.
IMPRESSION: No evidence of acute cardiopulmonary disease.

## 2023-09-23 ENCOUNTER — Ambulatory Visit: Payer: Self-pay

## 2023-12-12 ENCOUNTER — Ambulatory Visit
Admission: EM | Admit: 2023-12-12 | Discharge: 2023-12-12 | Disposition: A | Discharge status: Home / Self Care | Attending: Emergency Medicine | Admitting: Emergency Medicine

## 2023-12-12 ENCOUNTER — Ambulatory Visit (INDEPENDENT_AMBULATORY_CARE_PROVIDER_SITE_OTHER)

## 2023-12-12 DIAGNOSIS — M25551 Pain in right hip: Secondary | ICD-10-CM | POA: Diagnosis not present

## 2023-12-12 DIAGNOSIS — S93401A Sprain of unspecified ligament of right ankle, initial encounter: Secondary | ICD-10-CM | POA: Diagnosis not present

## 2023-12-12 DIAGNOSIS — Z8679 Personal history of other diseases of the circulatory system: Secondary | ICD-10-CM

## 2023-12-12 DIAGNOSIS — M25571 Pain in right ankle and joints of right foot: Secondary | ICD-10-CM

## 2023-12-12 MED ORDER — ACETAMINOPHEN 325 MG PO TABS
650.0000 mg | ORAL_TABLET | Freq: Once | ORAL | Status: AC
  Administered 2023-12-12: 650 mg via ORAL

## 2023-12-12 NOTE — ED Triage Notes (Signed)
 Pt c/o injury to right ankle  Pt states that she was stepping onto her porch and believes she rolled her ankle.  Pt has swelling along the lateral side of the right ankle.

## 2023-12-12 NOTE — ED Provider Notes (Signed)
 [facility]    CSN: [id number] Arrival date & time: 12/12/23  1843      History   Chief Complaint Chief Complaint  Patient presents with   Ankle Pain    HPI [name] is a 89 y.o. female.   89 year old female, [name], presents to urgent care with complaint of right ankle pain after stepping wrong out of truck ~ 1730 today. Pt c/o right ankle pain, + swelling. Pt did not fall per patient and family member with pt, pt ambulated to house and to vehicle from house. No treatment tried PTA.   Pmh: Prior right hip fracture with hardware  The history is provided by the patient. No language interpreter was used.    Past Medical History:  Diagnosis Date   Hyperlipidemia    Hypertension    Thyroid disease     Patient Active Problem List   Diagnosis Date Noted   Acute right ankle pain 12/12/2023   Sprain of right ankle 12/12/2023   Right hip pain 12/12/2023   History of hypertension 12/12/2023   Clinical depression 02/04/2015   H/O fall 02/04/2015   DDD (degenerative disc disease), lumbosacral 02/04/2015   Disease of thyroid gland 02/04/2015   Difficulty hearing 04/01/2013   Allergic rhinitis, seasonal 04/01/2013   DD (diverticular disease) 11/21/2012   Ceruminosis 11/21/2012   Generalized OA 11/21/2012   Adult hypothyroidism 11/21/2012   Menopausal and perimenopausal disorder 11/21/2012   BP (high blood pressure) 04/18/2012    Past Surgical History:  Procedure Laterality Date   BREAST SURGERY     benign tumor   CHOLECYSTECTOMY     DILATION AND CURETTAGE OF UTERUS     had several    OB History   No obstetric history on file.      Home Medications    Prior to Admission medications   Medication Sig Start Date End Date Taking? Authorizing Provider  cloNIDine (CATAPRES) 0.1 MG tablet Take 0.1 mg by mouth daily as needed (take if systolic >190 or diastolic >100).   Yes [provider]  hydrochlorothiazide (MICROZIDE) 12.5 MG  capsule Take 12.5 mg by mouth daily.   Yes [provider]  levothyroxine (SYNTHROID, LEVOTHROID) 50 MCG tablet Take 50 mcg by mouth daily before breakfast.   Yes [provider]    Family History Family History  Problem Relation Age of Onset   Hypertension Mother    Stroke Mother    Colon cancer Father    Stroke Brother    Hypertension Brother     Social History Social History   Tobacco Use   Smoking status: Never   Smokeless tobacco: Never  Vaping Use   Vaping status: Never Used  Substance Use Topics   Alcohol use: Yes    Alcohol/week: 1.0 standard drink of alcohol    Types: 1 Glasses of wine per week   Drug use: Never     Allergies   Oxycodone-acetaminophen  and Percocet [oxycodone-acetaminophen ]   Review of Systems Review of Systems  Constitutional:  Negative for fever.  Musculoskeletal:  Positive for arthralgias, gait problem, joint swelling and myalgias.  Skin: Negative.   All other systems reviewed and are negative.    Physical Exam Triage Vital Signs ED Triage Vitals  Encounter Vitals Group     BP      Girls Systolic BP Percentile      Girls Diastolic BP Percentile      Boys Systolic BP Percentile  Boys Diastolic BP Percentile      Pulse      Resp      Temp      Temp src      SpO2      Weight      Height      Head Circumference      Peak Flow      Pain Score      Pain Loc      Pain Education      Exclude from Growth Chart    No data found.  Updated Vital Signs BP (!) 150/65 (BP Location: Left Arm)   Pulse (!) 59   Temp 98.2 F (36.8 C) (Oral)   Resp 20   Ht 5' 1 (1.549 m)   Wt 108 lb (49 kg)   SpO2 98%   BMI 20.41 kg/m   Visual Acuity Right Eye Distance:   Left Eye Distance:   Bilateral Distance:    Right Eye Near:   Left Eye Near:    Bilateral Near:     Physical Exam Vitals and nursing note reviewed.  Cardiovascular:     Rate and Rhythm: Normal rate.     Pulses:          Dorsalis pedis pulses  are 2+ on the right side.  Pulmonary:     Effort: Pulmonary effort is normal.  Musculoskeletal:     Right hip: Bony tenderness present.     Right ankle: Swelling present. Tenderness present over the lateral malleolus.  Neurological:     General: No focal deficit present.     Mental Status: She is alert and oriented to person, place, and time.     GCS: GCS eye subscore is 4. GCS verbal subscore is 5. GCS motor subscore is 6.      UC Treatments / Results  Labs (all labs ordered are listed, but only abnormal results are displayed) Labs Reviewed - No data to display  EKG   Radiology DG Ankle Complete Right Result Date: 12/12/2023 CLINICAL DATA:  Right ankle injury and pain EXAM: RIGHT ANKLE - COMPLETE 3+ VIEW COMPARISON:  None Available. FINDINGS: Ossicle or remote injury at the fibular malleolar tip. No acute fracture or malalignment. Mortise is symmetric. Mild lateral soft tissue swelling IMPRESSION: No acute osseous abnormality. Mild lateral soft tissue swelling. Electronically Signed   By: Luke Bun M.D.   On: 12/12/2023 19:58   DG Hip Unilat With Pelvis 2-3 Views Right Result Date: 12/12/2023 CLINICAL DATA:  Injury right-sided hip and groin pain EXAM: DG HIP (WITH OR WITHOUT PELVIS) 2-3V RIGHT COMPARISON:  05/08/2004 FINDINGS: Numerous suture material overlying the abdomen and pelvis. SI joints are patent. Pubic symphysis appears intact. Old appearing fracture deformity of the right superior and bilateral inferior pubic rami. Right hip replacement with normal alignment. No definitive fracture is seen. Moderate severe left femoroacetabular degenerative change. Probable dystrophic calcifications over the bilateral hip soft tissue IMPRESSION: 1. Right hip replacement with normal alignment. No definitive acute osseous abnormality. 2. Old appearing fracture deformities of the right superior and bilateral inferior pubic rami. Electronically Signed   By: Luke Bun M.D.   On: 12/12/2023  19:58    Procedures Procedures (including critical care time)  Medications Ordered in UC Medications  acetaminophen  (TYLENOL ) tablet 650 mg (650 mg Oral Given 12/12/23 1917)    Initial Impression / Assessment and Plan / UC Course  I have reviewed the triage vital signs and the nursing notes.  Pertinent labs & imaging results that were available during my care of the patient were reviewed by me and considered in my medical decision making (see chart for details).  Clinical Course as of 12/12/23 2053  Thu Dec 12, 2023  1911 Right ankle xray,ice,elevation, tylenol  given for pain/swelling [JD]  1938 Called to x-ray as patient was complaining of right lateral hip pain/groin pain, x-ray ordered [JD]  2004 Right ankle ace wrap applied for sprain [JD]    Clinical Course User Index [JD] Osmar Howton, Rilla, NP  Discussed exam findings plan of care with patient and daughter, both verbalized understanding this provider, patient to follow-up with PCP, strict go to ER precautions given.   Ddx: Right ankle pain, ankle sprain, right ankle injury, right hip pain, history of hypertension Final Clinical Impressions(s) / UC Diagnoses   Final diagnoses:  Acute right ankle pain  Sprain of right ankle, unspecified ligament, initial encounter  Right hip pain  History of hypertension     Discharge Instructions      Your x-rays were negative for acute fracture or dislocation May take Tylenol  as label directed for pain May use lidocaine patch as label directed Follow-up with your PCP If you have new or worsening symptoms or pain worsens will need to go to the emergency room for further evaluation.     ED Prescriptions   None    I have reviewed the PDMP during this encounter.   Aminta Rilla, NP 12/12/23 2053

## 2023-12-12 NOTE — Discharge Instructions (Signed)
 Your x-rays were negative for acute fracture or dislocation May take Tylenol  as label directed for pain May use lidocaine patch as label directed Follow-up with your PCP If you have new or worsening symptoms or pain worsens will need to go to the emergency room for further evaluation.

## 2024-02-13 ENCOUNTER — Ambulatory Visit
Admission: RE | Admit: 2024-02-13 | Discharge: 2024-02-13 | Disposition: A | Discharge status: Home / Self Care | Payer: Self-pay | Source: Ambulatory Visit | Attending: Family Medicine | Admitting: Family Medicine

## 2024-02-13 VITALS — BP 146/68 | HR 73 | Temp 97.4°F | Wt 108.4 lb

## 2024-02-13 DIAGNOSIS — H9202 Otalgia, left ear: Secondary | ICD-10-CM | POA: Diagnosis not present

## 2024-02-13 DIAGNOSIS — H6123 Impacted cerumen, bilateral: Secondary | ICD-10-CM | POA: Diagnosis not present

## 2024-02-13 NOTE — Discharge Instructions (Addendum)
 Stop by the pharmacy to pick up Debrox or NeilMed clearcanal for earwax removal.

## 2024-02-13 NOTE — ED Triage Notes (Signed)
 Pt c/o left ear pain x3days  Pt states that it is a 8/10 pain and that the pain comes and goes.   Pt denies using qtips or trying to clean her ears.

## 2024-02-13 NOTE — ED Provider Notes (Signed)
 [facility]    CSN: [id number] Arrival date & time: 02/13/24  1150      History   Chief Complaint Chief Complaint  Patient presents with   Ear Injury    HPI [name] is a 89 y.o. female.   HPI  History provided by patient and her daughter.   [name] presents for left ear pain with associated blockage, fullness, hearing loss, and pressure.  Daughter reports her mom had to get her ears cleaned out every once and a while. Sometimes she get the ENT to do it.  [name] has been turning the TV up to the max and not able to hear conversations for the past week.   [name] has otherwise been well and has no other concerns.      Past Medical History:  Diagnosis Date   Hyperlipidemia    Hypertension    Thyroid disease     Patient Active Problem List   Diagnosis Date Noted   Acute right ankle pain 12/12/2023   Sprain of right ankle 12/12/2023   Right hip pain 12/12/2023   History of hypertension 12/12/2023   Clinical depression 02/04/2015   H/O fall 02/04/2015   DDD (degenerative disc disease), lumbosacral 02/04/2015   Disease of thyroid gland 02/04/2015   Difficulty hearing 04/01/2013   Allergic rhinitis, seasonal 04/01/2013   DD (diverticular disease) 11/21/2012   Ceruminosis 11/21/2012   Generalized OA 11/21/2012   Adult hypothyroidism 11/21/2012   Menopausal and perimenopausal disorder 11/21/2012   BP (high blood pressure) 04/18/2012    Past Surgical History:  Procedure Laterality Date   BREAST SURGERY     benign tumor   CHOLECYSTECTOMY     DILATION AND CURETTAGE OF UTERUS     had several    OB History   No obstetric history on file.      Home Medications    Prior to Admission medications   Medication Sig Start Date End Date Taking? Authorizing Provider  cloNIDine (CATAPRES) 0.1 MG tablet Take 0.1 mg by mouth daily as needed (take if systolic >190 or diastolic >100).   Yes [provider]  hydrochlorothiazide (MICROZIDE) 12.5 MG  capsule Take 12.5 mg by mouth daily.   Yes [provider]  levothyroxine (SYNTHROID, LEVOTHROID) 50 MCG tablet Take 50 mcg by mouth daily before breakfast.   Yes [provider]    Family History Family History  Problem Relation Age of Onset   Hypertension Mother    Stroke Mother    Colon cancer Father    Stroke Brother    Hypertension Brother     Social History Social History   Tobacco Use   Smoking status: Never   Smokeless tobacco: Never  Vaping Use   Vaping status: Never Used  Substance Use Topics   Alcohol use: Yes    Alcohol/week: 1.0 standard drink of alcohol    Types: 1 Glasses of wine per week   Drug use: Never     Allergies   Oxycodone-acetaminophen  and Percocet [oxycodone-acetaminophen ]   Review of Systems Review of Systems: :negative unless otherwise stated in HPI.      Physical Exam Triage Vital Signs ED Triage Vitals  Encounter Vitals Group     BP 02/13/24 1219 (!) 146/68     Girls Systolic BP Percentile --      Girls Diastolic BP Percentile --      Boys Systolic BP Percentile --      Boys Diastolic BP Percentile --  Pulse Rate 02/13/24 1219 73     Resp --      Temp 02/13/24 1219 (!) 97.4 F (36.3 C)     Temp Source 02/13/24 1219 Oral     SpO2 02/13/24 1219 96 %     Weight 02/13/24 1218 108 lb 6.4 oz (49.2 kg)     Height --      Head Circumference --      Peak Flow --      Pain Score 02/13/24 1218 8     Pain Loc --      Pain Education --      Exclude from Growth Chart --    No data found.  Updated Vital Signs BP (!) 146/68 (BP Location: Left Arm)   Pulse 73   Temp (!) 97.4 F (36.3 C) (Oral)   Wt 49.2 kg   SpO2 96%   BMI 20.48 kg/m   Visual Acuity Right Eye Distance:   Left Eye Distance:   Bilateral Distance:    Right Eye Near:   Left Eye Near:    Bilateral Near:     Physical Exam GEN:     alert, pleasant elderly female in no distress    HENT:  mucus membranes moist, no nasal discharge, right TM  not visible due to cerumen impaction, left TM not visible due to cerumen impaction, non-tender tragus EYES:   no scleral injection or discharge   RESP:  no increased work of breathing CVS:   regular rate  Skin:   warm and dry    UC Treatments / Results  Labs (all labs ordered are listed, but only abnormal results are displayed) Labs Reviewed - No data to display  EKG   Radiology No results found.  Procedures Procedures (including critical care time) Procedures Ear Cerumen Removal   Date/Time: 02/13/24   Performed by: nursing staff Authorized by: Caprice Porteous, DO   Consent:    Consent obtained:  Verbal   Consent given by:  Patient   Risks discussed:  Bleeding, dizziness, infection, incomplete removal, TM perforation and pain   Alternatives discussed:  No treatment Procedure details:    Location:  Left and right ear   Procedure type: irrigation   Post-procedure details:    Inspection:  TM intact    Hearing quality:  Improved    Patient tolerance of procedure:  Tolerated well, no immediate complications   Medications Ordered in UC Medications - No data to display  Initial Impression / Assessment and Plan / UC Course  I have reviewed the triage vital signs and the nursing notes.  Pertinent labs & imaging results that were available during my care of the patient were reviewed by me and considered in my medical decision making (see chart for details).      Cerumen impaction  Pt is a 89 y.o. female with 7 days of hearing  loss with left ear pain. Ceruminosis is noted bilaterally.  Wax is removed by syringing debridement.  On reassessment, TM clear and without erythema or bulging. No purulent discharge in canal. Doubt acute otitis media or externa. Hearing has improved. Instructions for home care to prevent wax buildup are given.  Discussed MDM, treatment plan and plan for follow-up with patient who agrees with plan.      Final Clinical Impressions(s) / UC  Diagnoses   Final diagnoses:  Bilateral hearing loss due to cerumen impaction  Otalgia of left ear     Discharge Instructions  Stop by the pharmacy to pick up Debrox or NeilMed clear canal for earwax removal.         ED Prescriptions   None    PDMP not reviewed this encounter.   Willia Lampert, DO 02/13/24 1447

## 2024-03-16 ENCOUNTER — Ambulatory Visit
Admission: EM | Admit: 2024-03-16 | Discharge: 2024-03-16 | Disposition: A | Discharge status: Home / Self Care | Attending: Emergency Medicine | Admitting: Emergency Medicine

## 2024-03-16 DIAGNOSIS — L03116 Cellulitis of left lower limb: Secondary | ICD-10-CM

## 2024-03-16 DIAGNOSIS — S81819A Laceration without foreign body, unspecified lower leg, initial encounter: Secondary | ICD-10-CM

## 2024-03-16 MED ORDER — CEFDINIR 300 MG PO CAPS: 300.0000 mg | ORAL_CAPSULE | Freq: Two times a day (BID) | ORAL | 0 refills | Status: AC

## 2024-03-16 NOTE — ED Provider Notes (Signed)
 [facility]    CSN: [id number] Arrival date & time: 03/16/24  1901      History   Chief Complaint Chief Complaint  Patient presents with   Abrasion    HPI [name] is a 89 y.o. female.   HPI  89 year old female with past medical history significant for hyperlipidemia, hypertension, thyroid disease presents for evaluation of redness and swelling at the site of an injury on her left shin.  She is here with her daughter who reports that 8 days ago she fell and sustained a skin tear.  No fever or drainage.  Past Medical History:  Diagnosis Date   Hyperlipidemia    Hypertension    Thyroid disease     Patient Active Problem List   Diagnosis Date Noted   Acute right ankle pain 12/12/2023   Sprain of right ankle 12/12/2023   Right hip pain 12/12/2023   History of hypertension 12/12/2023   Clinical depression 02/04/2015   H/O fall 02/04/2015   DDD (degenerative disc disease), lumbosacral 02/04/2015   Disease of thyroid gland 02/04/2015   Difficulty hearing 04/01/2013   Allergic rhinitis, seasonal 04/01/2013   DD (diverticular disease) 11/21/2012   Ceruminosis 11/21/2012   Generalized OA 11/21/2012   Adult hypothyroidism 11/21/2012   Menopausal and perimenopausal disorder 11/21/2012   BP (high blood pressure) 04/18/2012    Past Surgical History:  Procedure Laterality Date   BREAST SURGERY     benign tumor   CHOLECYSTECTOMY     DILATION AND CURETTAGE OF UTERUS     had several    OB History   No obstetric history on file.      Home Medications    Prior to Admission medications   Medication Sig Start Date End Date Taking? Authorizing Provider  cefdinir (OMNICEF) 300 MG capsule Take 1 capsule (300 mg total) by mouth 2 (two) times daily for 7 days. 03/16/24 03/23/24 Yes Bernardino Ditch, NP  hydrochlorothiazide (MICROZIDE) 12.5 MG capsule Take 12.5 mg by mouth daily.   Yes [provider]  levothyroxine (SYNTHROID, LEVOTHROID) 50 MCG  tablet Take 50 mcg by mouth daily before breakfast.   Yes [provider]  cloNIDine (CATAPRES) 0.1 MG tablet Take 0.1 mg by mouth daily as needed (take if systolic >190 or diastolic >100).    [provider]    Family History Family History  Problem Relation Age of Onset   Hypertension Mother    Stroke Mother    Colon cancer Father    Stroke Brother    Hypertension Brother     Social History Social History   Tobacco Use   Smoking status: Never   Smokeless tobacco: Never  Vaping Use   Vaping status: Never Used  Substance Use Topics   Alcohol use: Yes    Alcohol/week: 1.0 standard drink of alcohol    Types: 1 Glasses of wine per week   Drug use: Never     Allergies   Oxycodone-acetaminophen  and Percocet [oxycodone-acetaminophen ]   Review of Systems Review of Systems  Constitutional:  Negative for fever.  Skin:  Positive for color change and wound.     Physical Exam Triage Vital Signs ED Triage Vitals  Encounter Vitals Group     BP      Girls Systolic BP Percentile      Girls Diastolic BP Percentile      Boys Systolic BP Percentile      Boys Diastolic BP Percentile      Pulse  Resp      Temp      Temp src      SpO2      Weight      Height      Head Circumference      Peak Flow      Pain Score      Pain Loc      Pain Education      Exclude from Growth Chart    No data found.  Updated Vital Signs BP (!) 159/91 (BP Location: Left Arm)   Pulse 62   Temp 97.7 F (36.5 C) (Oral)   Resp 18   SpO2 93%   Visual Acuity Right Eye Distance:   Left Eye Distance:   Bilateral Distance:    Right Eye Near:   Left Eye Near:    Bilateral Near:     Physical Exam Vitals and nursing note reviewed.  Constitutional:      Appearance: Normal appearance. She is not ill-appearing.  HENT:     Head: Normocephalic and atraumatic.  Skin:    General: Skin is warm and dry.     Capillary Refill: Capillary refill takes less than 2 seconds.      Findings: Erythema present.  Neurological:     General: No focal deficit present.     Mental Status: She is alert and oriented to person, place, and time.      UC Treatments / Results  Labs (all labs ordered are listed, but only abnormal results are displayed) Labs Reviewed - No data to display  EKG   Radiology No results found.  Procedures Procedures (including critical care time)  Medications Ordered in UC Medications - No data to display  Initial Impression / Assessment and Plan / UC Course  I have reviewed the triage vital signs and the nursing notes.  Pertinent labs & imaging results that were available during my care of the patient were reviewed by me and considered in my medical decision making (see chart for details).   Patient is a pleasant, nontoxic-appearing 89 year old female presenting for evaluation of cellulitis to the left lower extremity after suffering a skin tear from a ground-level fall 8 days ago.  As you can see in image above, there is a piece of skin missing.  There is no drainage from the wound.  The wound bed has pink granulating tissue as well as some ecchymotic tissue.  The surrounding tissue is erythematous, hot to touch, and tender.  The patient's daughter indicates that she had been applying ointment to the area.  I have advised her to stop applying ointment and allow the area to scab over.  She can cover the area with a dry, nonstick dressing until the scab has formed.  Once the scab is formed she may leave it open to air when she is at home and cover with a dry dressing when out in public or when wearing pants to prevent the scab from being rubbed off.  I will start her on cefdinir 300 mg twice daily for 7 days as prevention against infection.  I have advised the patient and her daughter that if there is any increased redness, swelling, red she was called the leg, pus drainage from the wound, or fevers that they should return for  reevaluation.   Final Clinical Impressions(s) / UC Diagnoses   Final diagnoses:  Cellulitis of left lower extremity  Skin tear of lower leg without complication, initial encounter     Discharge Instructions  Take the cefdinir twice daily with food for 7 days to treat cellulitis from the skin tear to your left lower extremity.  Keep a nonstick dressing over the wound until a scab is formed.  Once the scab is formed you may leave it open to air when at home and cover with a dry dressing when out public or when wearing pants.  You may use over-the-counter Tylenol  according to the package instructions as needed for any pain.  If there is any increased redness, swelling, pus drainage, red streaks up the leg, or fever please return for reevaluation.     ED Prescriptions     Medication Sig Dispense Auth. Provider   cefdinir (OMNICEF) 300 MG capsule Take 1 capsule (300 mg total) by mouth 2 (two) times daily for 7 days. 14 capsule Bernardino Ditch, NP      PDMP not reviewed this encounter.   Bernardino Ditch, NP 03/16/24 2009

## 2024-03-16 NOTE — ED Triage Notes (Signed)
 Pt daughter states pt fell 8 days ago and cut her left lower leg. Now having redness and swelling at site.

## 2024-03-16 NOTE — Discharge Instructions (Addendum)
 Take the cefdinir twice daily with food for 7 days to treat cellulitis from the skin tear to your left lower extremity.  Keep a nonstick dressing over the wound until a scab is formed.  Once the scab is formed you may leave it open to air when at home and cover with a dry dressing when out public or when wearing pants.  You may use over-the-counter Tylenol  according to the package instructions as needed for any pain.  If there is any increased redness, swelling, pus drainage, red streaks up the leg, or fever please return for reevaluation.

## 2024-05-23 ENCOUNTER — Ambulatory Visit: Payer: Self-pay

## 2024-05-26 ENCOUNTER — Ambulatory Visit

## 2024-05-26 ENCOUNTER — Ambulatory Visit
Admission: RE | Admit: 2024-05-26 | Discharge: 2024-05-26 | Disposition: A | Discharge status: Home / Self Care | Payer: Self-pay | Attending: Emergency Medicine | Admitting: Emergency Medicine

## 2024-05-26 VITALS — BP 149/79 | HR 62 | Temp 97.6°F | Resp 16 | Wt 106.0 lb

## 2024-05-26 DIAGNOSIS — J9801 Acute bronchospasm: Secondary | ICD-10-CM

## 2024-05-26 DIAGNOSIS — J0100 Acute maxillary sinusitis, unspecified: Secondary | ICD-10-CM

## 2024-05-26 DIAGNOSIS — R051 Acute cough: Secondary | ICD-10-CM

## 2024-05-26 MED ORDER — AMOXICILLIN-POT CLAVULANATE 875-125 MG PO TABS: 1.0000 | ORAL_TABLET | Freq: Two times a day (BID) | ORAL | 0 refills | Status: AC

## 2024-05-26 MED ORDER — PREDNISONE 20 MG PO TABS: 40.0000 mg | ORAL_TABLET | Freq: Every day | ORAL | 0 refills | Status: AC

## 2024-05-26 MED ORDER — ALBUTEROL SULFATE HFA 108 (90 BASE) MCG/ACT IN AERS: 1.0000 | INHALATION_SPRAY | RESPIRATORY_TRACT | 0 refills | Status: AC | PRN

## 2024-05-26 MED ORDER — IPRATROPIUM BROMIDE 0.06 % NA SOLN: 2.0000 | Freq: Four times a day (QID) | NASAL | 0 refills | Status: AC

## 2024-05-26 MED ORDER — AEROCHAMBER MV MISC: 1 refills | Status: AC

## 2024-05-26 MED ORDER — BENZONATATE 200 MG PO CAPS: 200.0000 mg | ORAL_CAPSULE | Freq: Three times a day (TID) | ORAL | 0 refills | Status: AC | PRN

## 2024-05-26 NOTE — ED Provider Notes (Signed)
 " HPI  SUBJECTIVE:  [name] is a 89 y.o. female who presents with 1 week of a cough productive of green sputum, right maxillary, cheek swelling starting yesterday, postnasal drip, shortness of breath, dyspnea exertion, fatigue.  She is able to sleep at night without waking up coughing.  No fevers, nasal congestion, rhinorrhea, sinus pain or pressure upper dental pain, wheezing, chest pain.  No antipyretic in the past 6 hours.  No antibiotics in the past 3 months.  She has tried emergen-c vitamin supplement, Tylenol  without improvement in her symptoms.  No aggravating factors.  She has a past medical history of hypertension, hyperlipidemia, thyroid disease.  No history of chronic kidney disease.  PCP: [facility]. History primarily obtained from daughter.  Past Medical History:  Diagnosis Date   Hyperlipidemia    Hypertension    Thyroid disease     Past Surgical History:  Procedure Laterality Date   BREAST SURGERY     benign tumor   CHOLECYSTECTOMY     DILATION AND CURETTAGE OF UTERUS     had several    Family History  Problem Relation Age of Onset   Hypertension Mother    Stroke Mother    Colon cancer Father    Stroke Brother    Hypertension Brother     Social History[1]  Current Medications[2]  Allergies[3]   ROS  As noted in HPI.   Physical Exam  BP (!) 149/79 (BP Location: Right Arm)   Pulse 62   Temp 97.6 F (36.4 C) (Oral)   Resp 16   Wt 48.1 kg   SpO2 100%   BMI 20.03 kg/m   Constitutional: Well developed, well nourished, no acute distress.  Coughing.  Speaking in full sentences. Eyes: PERRL, EOMI, conjunctiva normal bilaterally HENT: Normocephalic, atraumatic,mucus membranes moist.  Mild nasal congestion.  Normal turbinates.  Positive maxillary sinus tenderness.  No frontal sinus tenderness.  No postnasal drip. Respiratory: Fair air movement, crackles bilateral bases.SABRA  Positive anterior, lateral chest wall tenderness Cardiovascular:  Normal rate and rhythm, no murmurs, no gallops, no rubs GI: nondistended skin: No rash, skin intact Musculoskeletal: no deformities Neurologic: Alert & oriented x 3, CN III-XII grossly intact, no motor deficits, sensation grossly intact Psychiatric: Speech and behavior appropriate   ED Course   Medications - No data to display  Orders Placed This Encounter  Procedures   DG Chest 2 View    Standing Status:   Standing    Number of Occurrences:   1    Reason for Exam (SYMPTOM  OR DIAGNOSIS REQUIRED):   Cough for a week.  Crackles bilateral bases rule out pneumonia   No results found for this or any previous visit (from the past 24 hours). DG Chest 2 View Result Date: 05/26/2024 CLINICAL DATA:  Cough for 1 week. EXAM: CHEST - 2 VIEW COMPARISON:  04/12/2023 FINDINGS: The heart size and mediastinal contours are within normal limits. Stable chronic elevation of right hemidiaphragm. Both lungs are clear. Old bilateral rib fracture deformities again noted. IMPRESSION: No active cardiopulmonary disease. Electronically Signed   By: Norleen DELENA Kil M.D.   On: 05/26/2024 10:34    ED Clinical Impression  1. Acute cough   2. Acute non-recurrent maxillary sinusitis   3. Bronchospasm, acute      ED Assessment/Plan     Outside records/labs reviewed.   Calculated creatinine clearance on most recent available labs in November 2024 33 mL/min  Patient presents today with acute maxillary sinusitis  and I am concerned for pneumonia.  Will check chest x-ray.  Reviewed imaging independently.  No acute changes compared to x-ray from April 12, 2023.SABRA  Formal radiology overread pending.  Will contact daughter Dorthea at [phone] if radiology overread differs enough from mine and we need to change management.   Reviewed radiology report.  No acute cardiopulmonary disease consistent with my read.  See radiology report for full details.  Plan to send home with 2 puffs from albuterol  inhaler with a  spacer every 4-6 hours, prednisone  40 mg for 5 days, saline nasal irrigation, Tessalon , Augmentin  875 mg p.o. twice daily for 7 days for sinusitis and lower respiratory tract infection, will add on azithromycin  Z-Pak if x-ray is positive for pneumonia.  Do not need to renally adjust any of these medicines.  X-ray negative for pneumonia.  Plan as above.  Will not add azithromycin .  Discussed  imaging, MDM, treatment plan, and plan for follow-up with family member discussed sn/sx that should prompt return to the ED. she agrees with plan.   Meds ordered this encounter  Medications   albuterol  (VENTOLIN  HFA) 108 (90 Base) MCG/ACT inhaler    Sig: Inhale 1-2 puffs into the lungs every 4 (four) hours as needed for wheezing or shortness of breath.    Dispense:  1 each    Refill:  0   benzonatate  (TESSALON ) 200 MG capsule    Sig: Take 1 capsule (200 mg total) by mouth 3 (three) times daily as needed for cough.    Dispense:  30 capsule    Refill:  0   predniSONE  (DELTASONE ) 20 MG tablet    Sig: Take 2 tablets (40 mg total) by mouth daily with breakfast for 5 days.    Dispense:  10 tablet    Refill:  0   ipratropium (ATROVENT ) 0.06 % nasal spray    Sig: Place 2 sprays into both nostrils 4 (four) times daily.    Dispense:  15 mL    Refill:  0   Spacer/Aero-Holding Chambers (AEROCHAMBER MV) inhaler    Sig: Use as instructed    Dispense:  1 each    Refill:  1   amoxicillin -clavulanate (AUGMENTIN ) 875-125 MG tablet    Sig: Take 1 tablet by mouth every 12 (twelve) hours.    Dispense:  14 tablet    Refill:  0      *This clinic note was created using Scientist, clinical (histocompatibility and immunogenetics). Therefore, there may be occasional mistakes despite careful proofreading. ?      [1]  Social History Tobacco Use   Smoking status: Never   Smokeless tobacco: Never  Vaping Use   Vaping status: Never Used  Substance Use Topics   Alcohol use: Yes    Alcohol/week: 1.0 standard drink of alcohol    Types: 1 Glasses  of wine per week   Drug use: Never  [2] No current facility-administered medications for this encounter.  Current Outpatient Medications:    albuterol  (VENTOLIN  HFA) 108 (90 Base) MCG/ACT inhaler, Inhale 1-2 puffs into the lungs every 4 (four) hours as needed for wheezing or shortness of breath., Disp: 1 each, Rfl: 0   amoxicillin -clavulanate (AUGMENTIN ) 875-125 MG tablet, Take 1 tablet by mouth every 12 (twelve) hours., Disp: 14 tablet, Rfl: 0   benzonatate  (TESSALON ) 200 MG capsule, Take 1 capsule (200 mg total) by mouth 3 (three) times daily as needed for cough., Disp: 30 capsule, Rfl: 0   ipratropium (ATROVENT ) 0.06 % nasal spray, Place 2 sprays  into both nostrils 4 (four) times daily., Disp: 15 mL, Rfl: 0   predniSONE  (DELTASONE ) 20 MG tablet, Take 2 tablets (40 mg total) by mouth daily with breakfast for 5 days., Disp: 10 tablet, Rfl: 0   Spacer/Aero-Holding Chambers (AEROCHAMBER MV) inhaler, Use as instructed, Disp: 1 each, Rfl: 1   cloNIDine (CATAPRES) 0.1 MG tablet, Take 0.1 mg by mouth daily as needed (take if systolic >190 or diastolic >100)., Disp: , Rfl:    hydrochlorothiazide (MICROZIDE) 12.5 MG capsule, Take 12.5 mg by mouth daily., Disp: , Rfl:    levothyroxine (SYNTHROID, LEVOTHROID) 50 MCG tablet, Take 50 mcg by mouth daily before breakfast., Disp: , Rfl:  [3]  Allergies Allergen Reactions   Oxycodone-Acetaminophen  Itching   Percocet [Oxycodone-Acetaminophen ] Itching     Van Knee, MD 05/27/24 1033  "

## 2024-05-26 NOTE — Discharge Instructions (Addendum)
 I did not appreciate any pneumonia on her x-ray.  We will contact you if the radiology overread differs enough from mine and we will add azithromycin  to the Augmentin  if it is positive for pneumonia.  2 puffs from an albuterol  inhaler with a spacer every 4-6 hours as needed for coughing, wheezing, prednisone  40 mg for 5 days.  Atrovent  nasal spray, saline nasal irrigation with a NeilMed sinus rinse and distilled water as often as you want for the nasal congestion and postnasal drip.  Tessalon  for cough.  Go to the ER if she gets worse.  Follow-up with her primary care provider if not better in 7 to 10 days.

## 2024-05-26 NOTE — ED Triage Notes (Signed)
 Patient presents to UC for cough and congestion x 7 days. Treating symptoms with Tylenol  and Emerg-c.   Denies fever.
